# Patient Record
Sex: Male | Born: 2000 | Race: White | Hispanic: No | Marital: Single | Smoking: Current some day smoker
Health system: Southern US, Community
[De-identification: ages and names within clinical notes are randomized; demographics above are authoritative.]

## PROBLEM LIST (undated history)

## (undated) DIAGNOSIS — R480 Dyslexia and alexia: Secondary | ICD-10-CM

## (undated) HISTORY — DX: Dyslexia and alexia: R48.0

---
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
## 2019-04-24 ENCOUNTER — Other Ambulatory Visit: Payer: Self-pay

## 2019-04-24 ENCOUNTER — Encounter: Payer: Self-pay | Admitting: Family Medicine

## 2019-04-24 ENCOUNTER — Ambulatory Visit (INDEPENDENT_AMBULATORY_CARE_PROVIDER_SITE_OTHER): Payer: PRIVATE HEALTH INSURANCE | Admitting: Family Medicine

## 2019-04-24 VITALS — Temp 98.1°F | Ht 73.0 in | Wt 241.6 lb

## 2019-04-24 DIAGNOSIS — H60502 Unspecified acute noninfective otitis externa, left ear: Secondary | ICD-10-CM

## 2019-04-24 DIAGNOSIS — H6120 Impacted cerumen, unspecified ear: Secondary | ICD-10-CM

## 2019-04-24 MED ORDER — NEOMYCIN-POLYMYXIN-HC 3.5-10000-1 OT SOLN: 4.0000 [drp] | Freq: Four times a day (QID) | OTIC | 0 refills | Status: AC

## 2019-04-24 NOTE — Progress Notes (Signed)
   Subjective:    Patient ID: Tracy Riley, male    DOB: 07-31-2001, 18 y.o.   MRN: [id number]  HPItrouble hearing out of left ear yesterday and started having ear pain today.   Ear pain discomfort on the left side felt fullness in the ear could not hear well came here at in order to be seen  Review of Systems Relates ear fullness some pain discomfort denies sinus congestion denies wheezing difficulty breathing    Objective:   Physical Exam  Significant cerumen buildup in left ear attempted to remove manually without success patient with redness and tenderness in the ear  Rest of exam normal lungs clear heart regular probable otitis externa along with cerumen buildup unable to remove it manually therefore we will send to ENT    Assessment & Plan:  Cerumen impaction Otitis externa Antibiotics drops prescribed Referral to ENT for removal

## 2019-05-12 ENCOUNTER — Encounter: Payer: Self-pay | Admitting: Family Medicine

## 2019-05-22 ENCOUNTER — Other Ambulatory Visit: Payer: Self-pay

## 2019-05-22 ENCOUNTER — Other Ambulatory Visit: Payer: Managed Care, Other (non HMO)

## 2019-05-22 DIAGNOSIS — Z20822 Contact with and (suspected) exposure to covid-19: Secondary | ICD-10-CM

## 2019-05-25 LAB — NOVEL CORONAVIRUS, NAA: SARS-CoV-2, NAA: NOT DETECTED

## 2019-06-27 ENCOUNTER — Telehealth: Payer: Self-pay | Admitting: Family Medicine

## 2019-06-27 DIAGNOSIS — Z131 Encounter for screening for diabetes mellitus: Secondary | ICD-10-CM

## 2019-06-27 DIAGNOSIS — Z13220 Encounter for screening for lipoid disorders: Secondary | ICD-10-CM

## 2019-06-27 DIAGNOSIS — Z114 Encounter for screening for human immunodeficiency virus [HIV]: Secondary | ICD-10-CM

## 2019-06-27 NOTE — Telephone Encounter (Signed)
Lipid, glucose Also HIV antibody per CDC guidelines as a one-time screening for his age

## 2019-06-27 NOTE — Telephone Encounter (Signed)
Labs ordered and pt is aware 

## 2019-06-27 NOTE — Telephone Encounter (Signed)
LAST LABS 07/11/18. CBC, BMP, LIVER, LIPID

## 2019-06-27 NOTE — Telephone Encounter (Signed)
HAS AN APPT FOR AUG 14TH FOR PHYSICAL AND NEEDS WELLNESS LABS DONE FOR HIS WORK.  WOULD LIKE TO HAVE THEN DONE IN ADVANCE AT [facility].

## 2019-07-06 LAB — HIV ANTIBODY (ROUTINE TESTING W REFLEX): HIV Screen 4th Generation wRfx: NONREACTIVE

## 2019-07-06 LAB — LIPID PANEL
Chol/HDL Ratio: 3.7 ratio (ref 0.0–5.0)
Cholesterol, Total: 174 mg/dL — ABNORMAL HIGH (ref 100–169)
HDL: 47 mg/dL (ref >39)
LDL Calculated: 107 mg/dL (ref 0–109)
Triglycerides: 100 mg/dL — ABNORMAL HIGH (ref 0–89)
VLDL Cholesterol Cal: 20 mg/dL (ref 5–40)

## 2019-07-06 LAB — GLUCOSE, RANDOM: Glucose: 85 mg/dL (ref 65–99)

## 2019-07-12 ENCOUNTER — Other Ambulatory Visit: Payer: Self-pay

## 2019-07-12 ENCOUNTER — Encounter: Payer: Self-pay | Admitting: Family Medicine

## 2019-07-12 ENCOUNTER — Ambulatory Visit (INDEPENDENT_AMBULATORY_CARE_PROVIDER_SITE_OTHER): Payer: PRIVATE HEALTH INSURANCE | Admitting: Family Medicine

## 2019-07-12 VITALS — BP 122/80 | Temp 98.1°F | Ht 71.0 in | Wt 243.5 lb

## 2019-07-12 DIAGNOSIS — M67912 Unspecified disorder of synovium and tendon, left shoulder: Secondary | ICD-10-CM | POA: Diagnosis not present

## 2019-07-12 DIAGNOSIS — Z0000 Encounter for general adult medical examination without abnormal findings: Secondary | ICD-10-CM | POA: Diagnosis not present

## 2019-07-12 NOTE — Patient Instructions (Signed)
Results for orders placed or performed in visit on 06/27/19  Lipid Profile  Result Value Ref Range   Cholesterol, Total 174 (H) 100 - 169 mg/dL   Triglycerides 100 (H) 0 - 89 mg/dL   HDL 47 >39 mg/dL   VLDL Cholesterol Cal 20 5 - 40 mg/dL   LDL Calculated 107 0 - 109 mg/dL   Chol/HDL Ratio 3.7 0.0 - 5.0 ratio  Glucose  Result Value Ref Range   Glucose 85 65 - 99 mg/dL  HIV antibody (with reflex)  Result Value Ref Range   HIV Screen 4th Generation wRfx Non Reactive Non Reactive
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
## 2019-07-13 ENCOUNTER — Encounter: Payer: Self-pay | Admitting: Family Medicine

## 2019-09-14 ENCOUNTER — Other Ambulatory Visit: Payer: Self-pay | Admitting: *Deleted

## 2019-09-14 ENCOUNTER — Ambulatory Visit (INDEPENDENT_AMBULATORY_CARE_PROVIDER_SITE_OTHER)
Admission: RE | Admit: 2019-09-14 | Discharge: 2019-09-14 | Disposition: A | Discharge status: Home / Self Care | Payer: PRIVATE HEALTH INSURANCE | Source: Ambulatory Visit

## 2019-09-14 DIAGNOSIS — B9789 Other viral agents as the cause of diseases classified elsewhere: Secondary | ICD-10-CM | POA: Diagnosis not present

## 2019-09-14 DIAGNOSIS — Z20822 Contact with and (suspected) exposure to covid-19: Secondary | ICD-10-CM

## 2019-09-14 DIAGNOSIS — R05 Cough: Secondary | ICD-10-CM

## 2019-09-14 DIAGNOSIS — J069 Acute upper respiratory infection, unspecified: Secondary | ICD-10-CM | POA: Diagnosis not present

## 2019-09-14 LAB — NOVEL CORONAVIRUS, NAA
SARS-CoV-2, NAA: DETECTED
SARS-CoV-2, NAA: DETECTED

## 2019-09-14 MED ORDER — CETIRIZINE HCL 10 MG PO CAPS: 10.0000 mg | ORAL_CAPSULE | Freq: Every day | ORAL | 0 refills | Status: DC

## 2019-09-14 MED ORDER — FLUTICASONE PROPIONATE 50 MCG/ACT NA SUSP: 1.0000 | Freq: Every day | NASAL | 2 refills | Status: DC

## 2019-09-14 NOTE — ED Provider Notes (Signed)
Virtual Visit via Video Note:  Tracy Riley  initiated request for Telemedicine visit with [facility] Urgent Care team. I connected with Tracy Riley  on 09/14/2019 at 8:19 PM  for a synchronized telemedicine visit using a video enabled HIPPA compliant telemedicine application. I verified that I am speaking with Tracy Riley  using two identifiers. Creighton Longley C Nichols Corter, PA-C  was physically located in a [facility] Urgent care site and MAHKAI FANGMAN was located at a different location.   The limitations of evaluation and management by telemedicine as well as the availability of in-person appointments were discussed. Patient was informed that he  may incur a bill ( including co-pay) for this virtual visit encounter. Tracy Riley  expressed understanding and gave verbal consent to proceed with virtual visit.     History of Present Illness:Tracy Riley  is a 18 y.o. male presents for evaluation of sinus pain and pressure.  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  He believes that there is a lot of dust and possible mold in the shed he was in and out of.  States that after this yesterday he started develop some congestion and cough.  Overnight he has developed more right-sided sinus pressure and pain.  Feels sinuses are very dry.  He has been using Alka-Seltzer cold and flu which he believes has helped with his symptoms.  He denies any fevers, notes his temperature has been 98 today.  He also does note that he had a recent close exposure to Covid.  He was tested this morning and results are pending.  Denies chest pain or shortness of breath.     Allergies  Allergen Reactions  . Zithromax [Azithromycin] Dermatitis     Past Medical History:  Diagnosis Date  . Dyslexia      Social History   Tobacco Use  . Smoking status: Never Smoker  . Smokeless tobacco: Never Used  Substance Use Topics  . Alcohol use: No  . Drug  use: No        Observations/Objective: Physical Exam  Constitutional: He is oriented to person, place, and time and well-developed, well-nourished, and in no distress. No distress.  HENT:  Head: Normocephalic and atraumatic.  Does not sound significantly congested  Neck: Normal range of motion.  Moving neck appropriately, no obvious erythema or swelling noted  Pulmonary/Chest: Effort normal. No respiratory distress.  Speaking in full sentences, no coughing during video visit  Musculoskeletal: Normal range of motion.     Comments: Moving all extremities appropriately, ambulating around house  Neurological: He is alert and oriented to person, place, and time.  Speech clear, face symmetric     Assessment and Plan:    ICD-10-CM   1. Viral URI with cough  J06.9     Patient likely with viral URI versus allergic rhinitis contributing to symptoms.  Possible Covid, continue to monitor for results to return.  At this time recommending continued symptomatic and supportive care.  Symptoms did not warrant antibiotic therapy at this time.  May continue Alka-Seltzer cold and flu or may try Zyrtec/Flonase.  Push fluids.Discussed strict return precautions. Patient verbalized understanding and is agreeable with plan.    Follow Up Instructions:     I discussed the assessment and treatment plan with the patient. The patient was provided an opportunity to ask questions and all were answered. The patient agreed with the plan and demonstrated an understanding  of the instructions.   The patient was advised to call back or seek an in-person evaluation if the symptoms worsen or if the condition fails to improve as anticipated.      Lew Dawes, PA-C  09/14/2019 8:19 PM        Lew Dawes, PA-C 09/14/19 2020

## 2019-09-14 NOTE — Discharge Instructions (Signed)
May try daily cetirizine/ flonase for sinus congestion/inflammation [place] for COVID results  Drink plenty of fluids  Follow up if symptoms not resolving over the next week

## 2019-09-16 LAB — NOVEL CORONAVIRUS, NAA: SARS-CoV-2, NAA: DETECTED — AB

## 2019-09-19 ENCOUNTER — Telehealth: Payer: Self-pay | Admitting: Family Medicine

## 2019-09-19 NOTE — Telephone Encounter (Signed)
More than likely the test is positive but just has not been posted

## 2019-09-19 NOTE — Telephone Encounter (Signed)
Autumn-call the health department and see if you can figure this out His chart does not reflect the results at this time Unless the health department is psychic I can not imagine how they can call a test positive when the results are not back  

## 2019-09-19 NOTE — Telephone Encounter (Addendum)
Mother states the health department advised her that both of her sons were positive and went over everything with her including quarantine and warning signs- mother is also positive and being followed by health department

## 2019-09-19 NOTE — Telephone Encounter (Signed)
Mom said the Health Department has called her and told her both of her kids, Zach and Collin have both tested positive for Covid.  But she has called [facility] twice and they have told her the tests aren't back yet.  No results in the system.  Health Dept seemed confused about this.  Mom has Covid so kids had to go for testing even though they have no symptoms at all.  She needs to know if they do have positive test results and why would Health Dept call her before the system shows the results?  Note in brothers chart as well. ° °

## 2019-09-20 ENCOUNTER — Telehealth: Payer: Self-pay

## 2019-09-20 NOTE — Telephone Encounter (Signed)
Phone call to pt. To inform of COVID test result; advised the result showed the virus was detected.  Pt. Stated he was tested due to exposure.  Denied any symptoms; denied fever, cough, or shortness of breath.  In starting to advise of the CDC guidelines for self isolation, pt. Reported that he had already received a call from [facility] with recommendations.  Stated he was advised to remain in quarantine through Sun., 11/1.  Pt. Denied any questions at this time.  Advised patient that the result was delayed in reaching patient's chart, (tested 10/22, resulted 10/28) and will send note to Dr. Wolfgang Phoenix, to make him aware.  Pt. Agreed with plan.

## 2019-11-17 ENCOUNTER — Encounter: Payer: Self-pay | Admitting: Family Medicine

## 2019-11-20 ENCOUNTER — Ambulatory Visit (INDEPENDENT_AMBULATORY_CARE_PROVIDER_SITE_OTHER): Payer: PRIVATE HEALTH INSURANCE | Admitting: Family Medicine

## 2019-11-20 DIAGNOSIS — R059 Cough, unspecified: Secondary | ICD-10-CM

## 2019-11-20 DIAGNOSIS — R05 Cough: Secondary | ICD-10-CM

## 2019-11-20 DIAGNOSIS — J4521 Mild intermittent asthma with (acute) exacerbation: Secondary | ICD-10-CM

## 2019-11-20 DIAGNOSIS — J019 Acute sinusitis, unspecified: Secondary | ICD-10-CM | POA: Diagnosis not present

## 2019-11-20 MED ORDER — CEFPROZIL 500 MG PO TABS: 500.0000 mg | ORAL_TABLET | Freq: Two times a day (BID) | ORAL | 0 refills | Status: DC

## 2019-11-20 MED ORDER — ALBUTEROL SULFATE HFA 108 (90 BASE) MCG/ACT IN AERS: 2.0000 | INHALATION_SPRAY | Freq: Four times a day (QID) | RESPIRATORY_TRACT | 0 refills | Status: DC | PRN

## 2019-11-20 MED ORDER — BENZONATATE 100 MG PO CAPS: 100.0000 mg | ORAL_CAPSULE | Freq: Three times a day (TID) | ORAL | 1 refills | Status: DC | PRN

## 2019-11-20 NOTE — Progress Notes (Signed)
   Subjective:    Patient ID: Tracy Riley, male    DOB: Oct 12, 2001, 18 y.o.   MRN: [id number]  Cough This is a recurrent problem. Episode onset: October. The cough is non-productive. Pertinent negatives include no chest pain, chills, ear pain, fever, rhinorrhea or wheezing. Associated symptoms comments: In the mornings pt sits up and has hacking cough. 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 He has tried nothing for the symptoms.  Patient already had Covid back in October Patient has been having ongoing cough early in the morning worse over the past few weeks but is around a smoker on a daily basis.  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  Location: Patient: home Provider: office   I discussed the limitations, risks, security and privacy concerns of performing an evaluation and management service by telephone and the availability of in person appointments. I also discussed with the patient that there may be a patient responsible charge related to this service. The patient expressed understanding and agreed to proceed.   History of Present Illness:    Observations/Objective:   Assessment and Plan:   Follow Up Instructions:    I discussed the assessment and treatment plan with the patient. The patient was provided an opportunity to ask questions and all were answered. The patient agreed with the plan and demonstrated an understanding of the instructions.   The patient was advised to call back or seek an in-person evaluation if the symptoms worsen or if the condition fails to improve as anticipated.  I provided 16 minutes of non-face-to-face time during this  encounter.   Vicente Males, LPN    Review of Systems  Constitutional: Negative for activity change, chills and fever.  HENT: Negative for congestion, ear pain and rhinorrhea.   Eyes: Negative for discharge.  Respiratory: Positive for cough. Negative for wheezing.   Cardiovascular: Negative for chest pain.  Gastrointestinal: Negative for nausea and vomiting.  Musculoskeletal: Negative for arthralgias.       Objective:   Physical Exam Today's visit was via telephone Physical exam was not possible for this visit       Assessment & Plan:  Reactive airway Possible bronchitis possible sinusitis 1 round of antibiotic Encourage patient to get away from smoke for as much as possible Follow-up if progressive troubles or worse or any setbacks May use Tessalon May use albuterol Hold off on any x-ray lab work Give [place] update via [place] within 2 weeks

## 2019-12-15 ENCOUNTER — Encounter: Payer: Self-pay | Admitting: Family Medicine

## 2020-02-21 ENCOUNTER — Encounter: Payer: Self-pay | Admitting: Family Medicine

## 2020-03-06 DIAGNOSIS — G540 Brachial plexus disorders: Secondary | ICD-10-CM | POA: Insufficient documentation

## 2020-03-12 ENCOUNTER — Ambulatory Visit: Payer: PRIVATE HEALTH INSURANCE | Admitting: Family Medicine

## 2020-03-12 ENCOUNTER — Other Ambulatory Visit: Payer: Self-pay

## 2020-03-12 ENCOUNTER — Encounter: Payer: Self-pay | Admitting: Family Medicine

## 2020-03-12 VITALS — BP 118/86 | HR 104 | Temp 98.3°F | Wt 263.4 lb

## 2020-03-12 DIAGNOSIS — M5416 Radiculopathy, lumbar region: Secondary | ICD-10-CM | POA: Diagnosis not present

## 2020-03-12 DIAGNOSIS — M79652 Pain in left thigh: Secondary | ICD-10-CM | POA: Diagnosis not present

## 2020-03-12 MED ORDER — DICLOFENAC SODIUM 75 MG PO TBEC: 75.0000 mg | DELAYED_RELEASE_TABLET | Freq: Two times a day (BID) | ORAL | 0 refills | Status: DC

## 2020-03-12 NOTE — Progress Notes (Signed)
Patient ID: Tracy Riley, male    DOB: 10-01-2001, 19 y.o.   MRN: [id number]   Chief Complaint  Patient presents with  . Hip Pain   Subjective:   HPI Pt stating having left hip/and thigh pain.  started when at work this AM.  Not doing anything at time.  Was just sitting at rest today and started with a small pinching feeling in lower back.  Yesterday moved a small tire about 10 lbs.  No other heavy lifting or exercising.  No meds taken for this.  No h/o back pain in past.  Pain worse with putting weight on the left leg or flexion. 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  Going to his chiropractor in 3 days.  No fall/injury to back/leg. Denies fever, dysuria, saddle anesthesia, bowel/bladder incontinence.   Medical History Tracy Riley has a past medical history of Dyslexia.   Outpatient Encounter Medications as of 03/12/2020  Medication Sig  . diclofenac (VOLTAREN) 75 MG EC tablet Take 1 tablet (75 mg total) by mouth 2 (two) times daily.  . [DISCONTINUED] albuterol (VENTOLIN HFA) 108 (90 Base) MCG/ACT inhaler Inhale 2 puffs into the lungs every 6 (six) hours as needed for wheezing.  . [DISCONTINUED] benzonatate (TESSALON PERLES) 100 MG capsule Take 1 capsule (100 mg total) by mouth 3 (three) times daily as needed for cough.  . [DISCONTINUED] cefPROZIL (CEFZIL) 500 MG tablet Take 1 tablet (500 mg total) by mouth 2 (two) times daily.  . [DISCONTINUED] Cetirizine HCl 10 MG CAPS Take 1 capsule (10 mg total) by mouth daily for 14 days.  . [DISCONTINUED] fluticasone (FLONASE) 50 MCG/ACT nasal spray Place 1-2 sprays into both nostrils daily. (Patient not taking: Reported on 11/20/2019)   No facility-administered encounter medications on file as of 03/12/2020.     Review of Systems  Constitutional: Negative for chills and fever.  HENT: Negative for congestion, rhinorrhea and sore throat.   Respiratory: Negative for cough, shortness of breath and wheezing.     Cardiovascular: Negative for chest pain and leg swelling.  Gastrointestinal: Negative for abdominal pain, diarrhea, nausea and vomiting.  Genitourinary: Negative for dysuria and frequency.  Musculoskeletal: Positive for back pain (left lower back). Negative for joint swelling and neck pain.       +Left leg pain  Skin: Negative for rash.  Neurological: Negative for dizziness, weakness and headaches.     Vitals BP 118/86   Pulse (!) 104   Temp 98.3 F (36.8 C)   Wt 263 lb 6.4 oz (119.5 kg)   SpO2 97%   BMI 36.74 kg/m   Objective:   Physical Exam Constitutional:      General: He is not in acute distress.    Appearance: Normal appearance.  HENT:     Head: Normocephalic.     Nose: Nose normal. No congestion.     Mouth/Throat:     Mouth: Mucous membranes are moist.     Pharynx: No oropharyngeal exudate.  Eyes:     Extraocular Movements: Extraocular movements intact.     Conjunctiva/sclera: Conjunctivae normal.     Pupils: Pupils are equal, round, and reactive to light.  Pulmonary:     Effort: No respiratory distress.  Musculoskeletal:        General: Normal range of motion.       Back:     Right lower leg: No edema.     Left lower leg: No edema.  Comments: Normal rom of left knee/ankle and hip.  Skin:    General: Skin is warm and dry.     Findings: No rash.  Neurological:     General: No focal deficit present.     Mental Status: He is alert and oriented to person, place, and time.  Psychiatric:        Mood and Affect: Mood normal.        Behavior: Behavior normal.      Assessment and Plan   1. Lumbar radiculopathy - diclofenac (VOLTAREN) 75 MG EC tablet; Take 1 tablet (75 mg total) by mouth 2 (two) times daily.  Dispense: 30 tablet; Refill: 0  2. Pain of left thigh - diclofenac (VOLTAREN) 75 MG EC tablet; Take 1 tablet (75 mg total) by mouth 2 (two) times daily.  Dispense: 30 tablet; Refill: 0   Likely lumbar radiculopathy vs. Muscle spasm.  Advising  voltaren bid and heating pad and topical icy hot prn. Call or rto if not improving in next 7-10 days.  Pt in agreement.  F/u prn.

## 2020-04-11 ENCOUNTER — Telehealth: Payer: Self-pay | Admitting: Family Medicine

## 2020-04-11 DIAGNOSIS — Z114 Encounter for screening for human immunodeficiency virus [HIV]: Secondary | ICD-10-CM

## 2020-04-11 DIAGNOSIS — Z13220 Encounter for screening for lipoid disorders: Secondary | ICD-10-CM

## 2020-04-11 DIAGNOSIS — Z79899 Other long term (current) drug therapy: Secondary | ICD-10-CM

## 2020-04-11 NOTE — Telephone Encounter (Signed)
Lab orders placed and pt is aware 

## 2020-04-11 NOTE — Telephone Encounter (Signed)
Last labs completed on 07/05/2019 HIV, glucose and lipid. Please advise. Thank you

## 2020-04-11 NOTE — Telephone Encounter (Signed)
Need to have blood work done soon as possible for work. He is wanting to know if he can get it done tomorrow.

## 2020-04-11 NOTE — Telephone Encounter (Signed)
Sorry I have no idea what that means--what b w and for what reason?

## 2020-04-11 NOTE — Telephone Encounter (Signed)
Pt states he has to get blood work done every year for work-- I let him know what tests were done in 2019 and 2020 and he states he needs to have the HIV glucose and lip again for this year.

## 2020-04-11 NOTE — Telephone Encounter (Signed)
Lip liv m7 hiv

## 2020-04-12 ENCOUNTER — Encounter: Payer: Self-pay | Admitting: Family Medicine

## 2020-04-12 ENCOUNTER — Other Ambulatory Visit: Payer: Self-pay

## 2020-04-12 ENCOUNTER — Ambulatory Visit: Payer: No Typology Code available for payment source | Admitting: Family Medicine

## 2020-04-12 VITALS — BP 132/80 | Temp 97.6°F | Ht 71.0 in | Wt 258.0 lb

## 2020-04-12 DIAGNOSIS — S8011XD Contusion of right lower leg, subsequent encounter: Secondary | ICD-10-CM

## 2020-04-12 DIAGNOSIS — M79604 Pain in right leg: Secondary | ICD-10-CM | POA: Diagnosis not present
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
## 2020-04-13 LAB — LIPID PANEL
Chol/HDL Ratio: 3.6 ratio (ref 0.0–5.0)
Cholesterol, Total: 150 mg/dL (ref 100–199)
HDL: 42 mg/dL (ref >39)
LDL Chol Calc (NIH): 84 mg/dL (ref 0–99)
Triglycerides: 133 mg/dL (ref 0–149)
VLDL Cholesterol Cal: 24 mg/dL (ref 5–40)

## 2020-04-13 LAB — BASIC METABOLIC PANEL
BUN/Creatinine Ratio: 9 (ref 9–20)
BUN: 9 mg/dL (ref 6–20)
CO2: 24 mmol/L (ref 20–29)
Calcium: 9.4 mg/dL (ref 8.7–10.2)
Chloride: 101 mmol/L (ref 96–106)
Creatinine, Ser: 1.05 mg/dL (ref 0.76–1.27)
GFR calc Af Amer: 118 mL/min/{1.73_m2} (ref >59)
GFR calc non Af Amer: 102 mL/min/{1.73_m2} (ref >59)
Glucose: 79 mg/dL (ref 65–99)
Potassium: 4.1 mmol/L (ref 3.5–5.2)
Sodium: 139 mmol/L (ref 134–144)

## 2020-04-13 LAB — HEPATIC FUNCTION PANEL
ALT: 18 IU/L (ref 0–44)
AST: 14 IU/L (ref 0–40)
Albumin: 4.3 g/dL (ref 4.1–5.2)
Alkaline Phosphatase: 112 IU/L (ref 55–125)
Bilirubin Total: 0.6 mg/dL (ref 0.0–1.2)
Bilirubin, Direct: 0.15 mg/dL (ref 0.00–0.40)
Total Protein: 6.9 g/dL (ref 6.0–8.5)

## 2020-04-13 LAB — HIV ANTIBODY (ROUTINE TESTING W REFLEX): HIV Screen 4th Generation wRfx: NONREACTIVE

## 2020-04-14 ENCOUNTER — Encounter: Payer: Self-pay | Admitting: Family Medicine

## 2020-04-15 ENCOUNTER — Ambulatory Visit (HOSPITAL_COMMUNITY)
Admission: RE | Admit: 2020-04-15 | Discharge: 2020-04-15 | Disposition: A | Discharge status: Home / Self Care | Payer: No Typology Code available for payment source | Source: Ambulatory Visit | Attending: Family Medicine | Admitting: Family Medicine

## 2020-04-15 ENCOUNTER — Encounter: Payer: Self-pay | Admitting: Family Medicine

## 2020-04-15 ENCOUNTER — Other Ambulatory Visit: Payer: Self-pay | Admitting: Family Medicine

## 2020-04-15 ENCOUNTER — Other Ambulatory Visit: Payer: Self-pay

## 2020-04-15 ENCOUNTER — Ambulatory Visit: Payer: No Typology Code available for payment source | Admitting: Family Medicine

## 2020-04-15 VITALS — BP 122/84 | Temp 98.2°F | Wt 258.6 lb

## 2020-04-15 DIAGNOSIS — S8011XD Contusion of right lower leg, subsequent encounter: Secondary | ICD-10-CM

## 2020-04-15 DIAGNOSIS — M7989 Other specified soft tissue disorders: Secondary | ICD-10-CM | POA: Insufficient documentation
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
## 2020-04-16 ENCOUNTER — Encounter: Payer: Self-pay | Admitting: Family Medicine

## 2020-04-16 LAB — D-DIMER, QUANTITATIVE: D-DIMER: 0.33 mg/L FEU (ref 0.00–0.49)

## 2020-04-19 ENCOUNTER — Encounter: Payer: Self-pay | Admitting: Family Medicine

## 2020-05-10 ENCOUNTER — Ambulatory Visit: Payer: No Typology Code available for payment source | Admitting: Family Medicine

## 2020-06-30 ENCOUNTER — Encounter: Payer: Self-pay | Admitting: Family Medicine

## 2020-08-19 ENCOUNTER — Encounter: Payer: Self-pay | Admitting: Family Medicine

## 2020-08-19 DIAGNOSIS — L600 Ingrowing nail: Secondary | ICD-10-CM

## 2020-08-20 MED ORDER — CEPHALEXIN 500 MG PO CAPS
ORAL_CAPSULE | ORAL | 0 refills | Status: DC
Start: 2020-08-20 — End: 2020-11-28

## 2020-08-20 NOTE — Telephone Encounter (Signed)
I recommend warm soaks Keflex 500 mg 1 taken 3 times daily for the next 7 days I also recommend referral to podiatry he will need partial removal of toenail

## 2020-08-20 NOTE — Addendum Note (Signed)
Addended by: Marlowe Shores on: 08/20/2020 01:44 PM   Modules accepted: Orders

## 2020-08-30 ENCOUNTER — Encounter: Payer: Self-pay | Admitting: Family Medicine

## 2020-09-20 ENCOUNTER — Ambulatory Visit (INDEPENDENT_AMBULATORY_CARE_PROVIDER_SITE_OTHER): Payer: No Typology Code available for payment source | Admitting: Family Medicine

## 2020-09-20 ENCOUNTER — Other Ambulatory Visit: Payer: Self-pay

## 2020-09-20 ENCOUNTER — Encounter: Payer: Self-pay | Admitting: Family Medicine

## 2020-09-20 VITALS — BP 122/82 | HR 82 | Temp 97.8°F | Ht 71.0 in | Wt 255.6 lb

## 2020-09-20 DIAGNOSIS — Z0000 Encounter for general adult medical examination without abnormal findings: Secondary | ICD-10-CM | POA: Diagnosis not present

## 2020-09-20 NOTE — Patient Instructions (Addendum)
Preventive Care 48-20 Years Old, Male Preventive care refers to lifestyle choices and visits with your health care provider that can promote health and wellness. This includes:  A yearly physical exam. This is also called an annual well check.  Regular dental and eye exams.  Immunizations.  Screening for certain conditions.  Healthy lifestyle choices, such as eating a healthy diet, getting regular exercise, not using drugs or products that contain nicotine and tobacco, and limiting alcohol use. What can I expect for my preventive care visit? Physical exam Your health care provider will check:  Height and weight. These may be used to calculate body mass index (BMI), which is a measurement that tells if you are at a healthy weight.  Heart rate and blood pressure.  Your skin for abnormal spots. Counseling Your health care provider may ask you questions about:  Alcohol, tobacco, and drug use.  Emotional well-being.  Home and relationship well-being.  Sexual activity.  Eating habits.  Work and work Statistician. What immunizations do I need?  Influenza (flu) vaccine  This is recommended every year. Tetanus, diphtheria, and pertussis (Tdap) vaccine  You may need a Td booster every 10 years. Varicella (chickenpox) vaccine  You may need this vaccine if you have not already been vaccinated. Human papillomavirus (HPV) vaccine  If recommended by your health care provider, you may need three doses over 6 months. Measles, mumps, and rubella (MMR) vaccine  You may need at least one dose of MMR. You may also need a second dose. Meningococcal conjugate (MenACWY) vaccine  One dose is recommended if you are 47-13 years old and a Market researcher living in a residence hall, or if you have one of several medical conditions. You may also need additional booster doses. Pneumococcal conjugate (PCV13) vaccine  You may need this if you have certain conditions and were not  previously vaccinated. Pneumococcal polysaccharide (PPSV23) vaccine  You may need one or two doses if you smoke cigarettes or if you have certain conditions. Hepatitis A vaccine  You may need this if you have certain conditions or if you travel or work in places where you may be exposed to hepatitis A. Hepatitis B vaccine  You may need this if you have certain conditions or if you travel or work in places where you may be exposed to hepatitis B. Haemophilus influenzae type b (Hib) vaccine  You may need this if you have certain risk factors. You may receive vaccines as individual doses or as more than one vaccine together in one shot (combination vaccines). Talk with your health care provider about the risks and benefits of combination vaccines. What tests do I need? Blood tests  Lipid and cholesterol levels. These may be checked every 5 years starting at age 5.  Hepatitis C test.  Hepatitis B test. Screening   Diabetes screening. This is done by checking your blood sugar (glucose) after you have not eaten for a while (fasting).  Sexually transmitted disease (STD) testing. Talk with your health care provider about your test results, treatment options, and if necessary, the need for more tests. Follow these instructions at home: Eating and drinking   Eat a diet that includes fresh fruits and vegetables, whole grains, lean protein, and low-fat dairy products.  Take vitamin and mineral supplements as recommended by your health care provider.  Do not drink alcohol if your health care provider tells you not to drink.  If you drink alcohol: ? Limit how much you have to 0-2  drinks a day. ? Be aware of how much alcohol is in your drink. In the [place] one drink equals one 12 oz bottle of beer (355 mL), one 5 oz glass of wine (148 mL), or one 1 oz glass of hard liquor (44 mL). Lifestyle  Take daily care of your teeth and gums.  Stay active. Exercise for at least 30 minutes on 5 or  more days each week.  Do not use any products that contain nicotine or tobacco, such as cigarettes, e-cigarettes, and chewing tobacco. If you need help quitting, ask your health care provider.  If you are sexually active, practice safe sex. Use a condom or other form of protection to prevent STIs (sexually transmitted infections). What's next?  Go to your health care provider once a year for a well check visit.  Ask your health care provider how often you should have your eyes and teeth checked.  Stay up to date on all vaccines. This information is not intended to replace advice given to you by your health care provider. Make sure you discuss any questions you have with your health care provider. Document Revised: 11/03/2018 Document Reviewed: 11/03/2018 Elsevier Patient Education  [place] Headache Without Cause A headache is pain or discomfort that is felt around the head or neck area. There are many causes and types of headaches. In some cases, the cause may not be found. Follow these instructions at home: Watch your condition for any changes. Let your doctor know about them. Take these steps to help with your condition: Managing pain      Take over-the-counter and prescription medicines only as told by your doctor.  Lie down in a dark, quiet room when you have a headache.  If told, put ice on your head and neck area: ? Put ice in a plastic bag. ? Place a towel between your skin and the bag. ? Leave the ice on for 20 minutes, 2-3 times per day.  If told, put heat on the affected area. Use the heat source that your doctor recommends, such as a moist heat pack or a heating pad. ? Place a towel between your skin and the heat source. ? Leave the heat on for 20-30 minutes. ? Remove the heat if your skin turns bright red. This is very important if you are unable to feel pain, heat, or cold. You may have a greater risk of getting burned.  Keep lights dim if bright  lights bother you or make your headaches worse. Eating and drinking  Eat meals on a regular schedule.  If you drink alcohol: ? Limit how much you use to:  0-1 drink a day for women.  0-2 drinks a day for men. ? Be aware of how much alcohol is in your drink. In the [place] one drink equals one 12 oz bottle of beer (355 mL), one 5 oz glass of wine (148 mL), or one 1 oz glass of hard liquor (44 mL).  Stop drinking caffeine, or reduce how much caffeine you drink. General instructions   Keep a journal to find out if certain things bring on headaches. For example, write down: ? What you eat and drink. ? How much sleep you get. ? Any change to your diet or medicines.  Get a massage or try other ways to relax.  Limit stress.  Sit up straight. Do not tighten (tense) your muscles.  Do not use any products that contain nicotine or tobacco. This includes cigarettes,  e-cigarettes, and chewing tobacco. If you need help quitting, ask your doctor.  Exercise regularly as told by your doctor.  Get enough sleep. This often means 7-9 hours of sleep each night.  Keep all follow-up visits as told by your doctor. This is important. Contact a doctor if:  Your symptoms are not helped by medicine.  You have a headache that feels different than the other headaches.  You feel sick to your stomach (nauseous) or you throw up (vomit).  You have a fever. Get help right away if:  Your headache gets very bad quickly.  Your headache gets worse after a lot of physical activity.  You keep throwing up.  You have a stiff neck.  You have trouble seeing.  You have trouble speaking.  You have pain in the eye or ear.  Your muscles are weak or you lose muscle control.  You lose your balance or have trouble walking.  You feel like you will pass out (faint) or you pass out.  You are mixed up (confused).  You have a seizure. Summary  A headache is pain or discomfort that is felt around the  head or neck area.  There are many causes and types of headaches. In some cases, the cause may not be found.  Keep a journal to help find out what causes your headaches. Watch your condition for any changes. Let your doctor know about them.  Contact a doctor if you have a headache that is different from usual, or if your headache is not helped by medicine.  Get help right away if your headache gets very bad, you throw up, you have trouble seeing, you lose your balance, or you have a seizure. This information is not intended to replace advice given to you by your health care provider. Make sure you discuss any questions you have with your health care provider. Document Revised: 05/30/2018 Document Reviewed: 05/30/2018 Elsevier Patient Education  [place].

## 2020-09-20 NOTE — Progress Notes (Signed)
Patient ID: Tracy Riley, male    DOB: Nov 01, 2001, 19 y.o.   MRN: [id number]   Chief Complaint  Patient presents with  . Annual Exam   Subjective:  CC: annual wellness  Presents today for a wellness visit.  He is going into [employer], trying to get in shape, has recently lost 10 pounds intentionally.  It is noted that on October 8. A   Referral was sent for podiatry for an ingrown toenail, he has a visit with them next week.  His weight loss goals are an additional 25 pounds.  He reports that he has a dull headache much of the time, his last negative Covid test was 2 weeks ago, he tries to stay hydrated, he takes Aleve which works, however it sounds like his sleep habits are not great.    Medical History Mckinley has a past medical history of Dyslexia.   Outpatient Encounter Medications as of 09/20/2020  Medication Sig  . cephALEXin (KEFLEX) 500 MG capsule Take one capsule po TID for next 7 days (Patient not taking: Reported on 09/20/2020)   No facility-administered encounter medications on file as of 09/20/2020.     Review of Systems  Constitutional: Negative for chills, fever and unexpected weight change.  Respiratory: Negative for cough, chest tightness and shortness of breath.   Cardiovascular: Negative for chest pain and leg swelling.  Gastrointestinal: Negative for abdominal pain, blood in stool, diarrhea, nausea and vomiting.  Genitourinary: Negative for dysuria, hematuria, scrotal swelling and testicular pain.  Neurological: Positive for headaches.       Dull constant headache 1/10.  Psychiatric/Behavioral: Negative for self-injury.     Vitals BP 122/82   Pulse 82   Temp 97.8 F (36.6 C) (Oral)   Ht 5\' 11"  (1.803 m)   Wt 255 lb 9.6 oz (115.9 kg)   SpO2 98%   BMI 35.65 kg/m   Objective:   Physical Exam Vitals and nursing note reviewed.  Constitutional:      Appearance: Normal appearance. He is obese.     Comments: Actively working on lifestyle  modifications.   HENT:     Head: Normocephalic.     Right Ear: Tympanic membrane normal.     Left Ear: Tympanic membrane normal.     Nose: Nose normal.     Mouth/Throat:     Mouth: Mucous membranes are moist.     Pharynx: Oropharynx is clear.  Cardiovascular:     Rate and Rhythm: Normal rate and regular rhythm.     Heart sounds: Normal heart sounds.  Pulmonary:     Effort: Pulmonary effort is normal.     Breath sounds: Normal breath sounds.  Abdominal:     General: Bowel sounds are normal.     Palpations: Abdomen is soft.     Tenderness: There is no abdominal tenderness.  Genitourinary:    Comments: Performs self-testicular exams monthly.  Musculoskeletal:        General: Normal range of motion.     Cervical back: Normal range of motion.  Skin:    General: Skin is warm and dry.  Neurological:     Mental Status: He is alert and oriented to person, place, and time.  Psychiatric:        Mood and Affect: Mood normal.        Behavior: Behavior normal.        Thought Content: Thought content normal.        Judgment: Judgment normal.  Assessment and Plan   1. Well adult on routine health check   Encouraged continued healthy habits, weight loss goal is 220- 225 pounds.  Questions regarding lactose intolerant, seems to tolerate some dairy but not all, showed me a picture of  his right lower extremity where he dropped a truck on his leg, reports that he does not have any feeling in this area, it is a miracle he did not lose his extremity. The bruising has healed as this occurred in May 2021.  Last labs were drawn in May 2021, I am sure they have already been reviewed, however,  I reviewed them once again with him.  He is not being treated for any chronic conditions.  Encouraged better sleep hygiene and consistent hydration for the headaches.  Agrees with plan of care discussed today. Understands warning signs to seek further care: Any significant changes in health  status. Understands to follow-up in one year for wellness, sooner if anything changes.    Novella Olive, NP 09/20/2020

## 2020-09-20 NOTE — Progress Notes (Signed)
The patient comes in today for a wellness visit.    Patient ID: Tracy Riley, male    DOB: [birth year], 19 y.o.   MRN: [id number]   Chief Complaint  Patient presents with  . Annual Exam   Subjective:    HPI   Medical History Tracy Riley has a past medical history of Dyslexia.   Outpatient Encounter Medications as of 09/20/2020  Medication Sig  . cephALEXin (KEFLEX) 500 MG capsule Take one capsule po TID for next 7 days (Patient not taking: Reported on 09/20/2020)   No facility-administered encounter medications on file as of 09/20/2020.     Review of Systems   Vitals BP 122/82   Pulse 82   Temp 97.8 F (36.6 C) (Oral)   Ht 5\' 11"  (1.803 m)   Wt 255 lb 9.6 oz (115.9 kg)   SpO2 98%   BMI 35.65 kg/m   Objective:   Physical Exam   Assessment and Plan   There are no diagnoses linked to this encounter.    A review of their health history was completed.  A review of medications was also completed.  Any needed refills; none  Eating habits: eating healthy seeing Dr for weight loss and diet  Falls/  MVA accidents in past few months: none  Regular exercise: everyday- training for [employer]  Specialist pt sees on regular basis: non  Preventative health issues were discussed.   Additional concerns: none

## 2020-09-25 ENCOUNTER — Encounter: Payer: Self-pay | Admitting: Family Medicine

## 2020-09-25 DIAGNOSIS — L600 Ingrowing nail: Secondary | ICD-10-CM

## 2020-09-25 NOTE — Addendum Note (Signed)
Addended by: Marlowe Shores on: 09/25/2020 02:10 PM   Modules accepted: Orders

## 2020-09-25 NOTE — Telephone Encounter (Signed)
Nurses May put in additional referral for podiatry This can be at a different podiatrist thanks Ingrown toenail will need removal and ablation

## 2020-10-09 ENCOUNTER — Ambulatory Visit: Payer: No Typology Code available for payment source | Admitting: Podiatry

## 2020-10-14 ENCOUNTER — Ambulatory Visit (INDEPENDENT_AMBULATORY_CARE_PROVIDER_SITE_OTHER): Payer: No Typology Code available for payment source | Admitting: Podiatry

## 2020-10-14 ENCOUNTER — Encounter: Payer: Self-pay | Admitting: Podiatry

## 2020-10-14 ENCOUNTER — Other Ambulatory Visit: Payer: Self-pay

## 2020-10-14 DIAGNOSIS — L600 Ingrowing nail: Secondary | ICD-10-CM | POA: Diagnosis not present

## 2020-10-20 NOTE — Progress Notes (Signed)
Subjective:   Patient ID: Tracy Riley, male   DOB: 19 y.o.   MRN: [id number]   HPI Patient presents stating he has had a painful ingrown toenail of his right big toe and states he tried to trim it and soak it and has had some history left not as painful.  Patient does not smoke likes to be active   Review of Systems  All other systems reviewed and are negative.       Objective:  Physical Exam Vitals and nursing note reviewed.  Constitutional:      Appearance: He is well-developed.  Pulmonary:     Effort: Pulmonary effort is normal.  Musculoskeletal:        General: Normal range of motion.  Skin:    General: Skin is warm.  Neurological:     Mental Status: He is alert.     Neurovascular status intact muscle strength adequate range of motion adequate.  Patient's left hallux is mildly ingrown and the right hallux is quite incurvated in the corner medial corner with pain and no redness no active drainage noted     Assessment:  Ingrown toenail deformity of the right over left hallux painful when palpated     Plan:  H&P reviewed condition and recommended correction of deformity.  Explained procedure risk and patient signed consent form and today I infiltrated the right hallux 60 mg Xylocaine Marcaine mixture sterile prep done and using sterile instrumentation remove the border exposed matrix applied phenol 3 applications 30 seconds followed by alcohol lavage sterile dressing and gave instructions on soaks.  Reappoint to recheck and encouraged to leave dressing on 24 hours but take it off earlier if any throbbing were to occur and encouraged to call with questions concerns

## 2020-11-28 ENCOUNTER — Encounter: Payer: Self-pay | Admitting: Family Medicine

## 2020-11-28 ENCOUNTER — Other Ambulatory Visit: Payer: Self-pay

## 2020-11-28 ENCOUNTER — Ambulatory Visit (INDEPENDENT_AMBULATORY_CARE_PROVIDER_SITE_OTHER): Payer: No Typology Code available for payment source | Admitting: Family Medicine

## 2020-11-28 VITALS — HR 88 | Temp 98.6°F | Resp 16

## 2020-11-28 DIAGNOSIS — R059 Cough, unspecified: Secondary | ICD-10-CM

## 2020-11-28 DIAGNOSIS — H66002 Acute suppurative otitis media without spontaneous rupture of ear drum, left ear: Secondary | ICD-10-CM | POA: Diagnosis not present

## 2020-11-28 LAB — POCT RAPID STREP A (OFFICE): Rapid Strep A Screen: NEGATIVE

## 2020-11-28 MED ORDER — BENZONATATE 100 MG PO CAPS: 100.0000 mg | ORAL_CAPSULE | Freq: Two times a day (BID) | ORAL | 0 refills | Status: DC | PRN

## 2020-11-28 MED ORDER — AMOXICILLIN 500 MG PO CAPS: 500.0000 mg | ORAL_CAPSULE | Freq: Two times a day (BID) | ORAL | 0 refills | Status: DC

## 2020-11-28 NOTE — Patient Instructions (Signed)
Check blood sugar and let us know if it is > 100 in the morning before eating. If the lymph node in neck does not go away after antibiotic come back to be seen.      Otitis Media, Adult  Otitis media means that the middle ear is red and swollen (inflamed) and full of fluid. The condition usually goes away on its own. Follow these instructions at home:  Take over-the-counter and prescription medicines only as told by your doctor.  If you were prescribed an antibiotic medicine, take it as told by your doctor. Do not stop taking the antibiotic even if you start to feel better.  Keep all follow-up visits as told by your doctor. This is important. Contact a doctor if:  You have bleeding from your nose.  There is a lump on your neck.  You are not getting better in 5 days.  You feel worse instead of better. Get help right away if:  You have pain that is not helped with medicine.  You have swelling, redness, or pain around your ear.  You get a stiff neck.  You cannot move part of your face (paralyzed).  You notice that the bone behind your ear hurts when you touch it.  You get a very bad headache. Summary  Otitis media means that the middle ear is red, swollen, and full of fluid.  This condition usually goes away on its own. In some cases, treatment may be needed.  If you were prescribed an antibiotic medicine, take it as told by your doctor. This information is not intended to replace advice given to you by your health care provider. Make sure you discuss any questions you have with your health care provider. Document Revised: 10/22/2017 Document Reviewed: 11/30/2016 Elsevier Patient Education  2020 [employer].

## 2020-11-28 NOTE — Progress Notes (Signed)
Patient ID: Tracy Riley, male    DOB: 11-11-2001, 20 y.o.   MRN: [id number]   Chief Complaint  Patient presents with  . Cough   Subjective:  CC: cough and ear pain  This is a new problem.  Presents today for an acute visit with a complaint of cough with sputum production.  Associated symptoms include headache, nasal congestion, postnasal drip, runny nose, and sore throat.  Pertinent negatives include no fever, no chills, no chest pain, no shortness of breath.  Sore throat appeared today with cough.  He reports that he has had a headache off and on for months.  Reports that his lymph nodes are swollen as well.  Has tried over-the-counter allergy medication and Aleve which has helped some.  Cough This is a new problem. Episode onset: 2 weeks. The problem occurs constantly. The cough is productive of sputum. Associated symptoms include headaches, nasal congestion, postnasal drip, rhinorrhea and a sore throat. Pertinent negatives include no chest pain, chills, ear pain, fever or shortness of breath. Associated symptoms comments: Swollen lymph nodes under jaw and ears- painful to touch and movement.  Headaches on and off for 2 months. . Treatments tried: otc allergy meds.      Medical History Piercen has a past medical history of Dyslexia.   Outpatient Encounter Medications as of 11/28/2020  Medication Sig  . amoxicillin (AMOXIL) 500 MG capsule Take 1 capsule (500 mg total) by mouth 2 (two) times daily.  . benzonatate (TESSALON) 100 MG capsule Take 1 capsule (100 mg total) by mouth 2 (two) times daily as needed for cough.  . [DISCONTINUED] cephALEXin (KEFLEX) 500 MG capsule Take one capsule po TID for next 7 days (Patient not taking: Reported on 09/20/2020)   No facility-administered encounter medications on file as of 11/28/2020.     Review of Systems  Constitutional: Negative for chills and fever.  HENT: Positive for congestion, postnasal drip, rhinorrhea, sinus pressure, sinus pain  and sore throat. Negative for ear pain.   Respiratory: Positive for cough. Negative for shortness of breath.   Cardiovascular: Negative for chest pain.  Gastrointestinal: Negative for abdominal pain.  Neurological: Positive for headaches.       Since covid in oct 2020  Hematological: Positive for adenopathy.     Vitals Pulse 88   Temp 98.6 F (37 C)   Resp 16   SpO2 99%   Objective:   Physical Exam Vitals reviewed.  Constitutional:      General: He is not in acute distress. HENT:     Right Ear: Tympanic membrane normal.     Left Ear: Tenderness present. Tympanic membrane is erythematous.     Nose: Rhinorrhea present.     Left Turbinates: Swollen.     Right Sinus: No maxillary sinus tenderness or frontal sinus tenderness.     Left Sinus: No maxillary sinus tenderness or frontal sinus tenderness.     Mouth/Throat:     Pharynx: Posterior oropharyngeal erythema present.     Tonsils: 1+ on the right. 1+ on the left.  Cardiovascular:     Rate and Rhythm: Normal rate and regular rhythm.     Heart sounds: Normal heart sounds.  Pulmonary:     Effort: Pulmonary effort is normal.     Breath sounds: Normal breath sounds.  Lymphadenopathy:     Cervical: Cervical adenopathy present.  Skin:    General: Skin is warm and dry.  Neurological:     General: No focal deficit present.  Mental Status: He is alert.  Psychiatric:        Behavior: Behavior normal.      Assessment and Plan   1. Cough in adult - benzonatate (TESSALON) 100 MG capsule; Take 1 capsule (100 mg total) by mouth 2 (two) times daily as needed for cough.  Dispense: 20 capsule; Refill: 0 - Culture, Group A Strep - POCT rapid strep A - Novel Coronavirus, NAA ([facility])  2. Non-recurrent acute suppurative otitis media of left ear without spontaneous rupture of tympanic membrane - amoxicillin (AMOXIL) 500 MG capsule; Take 1 capsule (500 mg total) by mouth 2 (two) times daily.  Dispense: 14 capsule; Refill: 0    Left TM erythematous, left cervical adenopathy, will treat for otitis media.  Supportive therapy recommended, adequate hydration, saline nasal flushes, over-the-counter medications for symptom relief, medication sent for cough.  Rapid strep negative, will send for culture.  COVID test pending, will notify once results are available.  At the end of the visit he reports that earlier today his right hand was shaking/with tremors.  Reports this happened twice and resolved.  He wonders if he has diabetes.  Reports that he has a glucose meter, he is instructed to take his blood sugar several times fasting to see what his morning blood sugar is.  Agrees with plan of care discussed today. Understands warning signs to seek further care: Pain, shortness of breath, any significant change in health. Understands to follow-up if symptoms do not improve, or worsen.  He is instructed to follow-up once antibiotic is completed if his cervical adenopathy  does not resolve.  He will also follow-up if his blood sugars are consistently greater than 100 fasting.

## 2020-11-30 LAB — NOVEL CORONAVIRUS, NAA: SARS-CoV-2, NAA: DETECTED — AB

## 2020-11-30 LAB — SARS-COV-2, NAA 2 DAY TAT

## 2020-12-01 ENCOUNTER — Other Ambulatory Visit: Payer: Self-pay | Admitting: Family Medicine

## 2020-12-01 ENCOUNTER — Encounter: Payer: Self-pay | Admitting: Family Medicine

## 2020-12-01 DIAGNOSIS — U071 COVID-19: Secondary | ICD-10-CM | POA: Insufficient documentation

## 2020-12-01 LAB — SPECIMEN STATUS REPORT

## 2020-12-01 LAB — CULTURE, GROUP A STREP: Strep A Culture: NEGATIVE

## 2020-12-01 MED ORDER — ALBUTEROL SULFATE HFA 108 (90 BASE) MCG/ACT IN AERS: 2.0000 | INHALATION_SPRAY | Freq: Four times a day (QID) | RESPIRATORY_TRACT | 0 refills | Status: DC | PRN

## 2020-12-01 NOTE — Progress Notes (Signed)
Covid detected per PCR.

## 2020-12-06 ENCOUNTER — Encounter: Payer: Self-pay | Admitting: Family Medicine

## 2021-01-19 ENCOUNTER — Encounter: Payer: No Typology Code available for payment source | Admitting: Oncology

## 2021-01-19 ENCOUNTER — Encounter: Payer: Self-pay | Admitting: Oncology

## 2021-01-19 DIAGNOSIS — R112 Nausea with vomiting, unspecified: Secondary | ICD-10-CM | POA: Diagnosis not present

## 2021-01-19 MED ORDER — ONDANSETRON HCL 4 MG PO TABS: 4.0000 mg | ORAL_TABLET | Freq: Three times a day (TID) | ORAL | 0 refills | Status: DC | PRN

## 2021-01-19 NOTE — Progress Notes (Signed)
Tracy Riley, hoopes are scheduled for a virtual visit with your provider today.    Just as we do with appointments in the office, we must obtain your consent to participate.  Your consent will be active for this visit and any virtual visit you may have with one of our providers in the next 365 days.    If you have a MyChart account, I can also send a copy of this consent to you electronically.  All virtual visits are billed to your insurance company just like a traditional visit in the office.  As this is a virtual visit, video technology does not allow for your provider to perform a traditional examination.  This may limit your provider's ability to fully assess your condition.  If your provider identifies any concerns that need to be evaluated in person or the need to arrange testing such as labs, EKG, etc, we will make arrangements to do so.    Although advances in technology are sophisticated, we cannot ensure that it will always work on either your end or our end.  If the connection with a video visit is poor, we may have to switch to a telephone visit.  With either a video or telephone visit, we are not always able to ensure that we have a secure connection.   I need to obtain your verbal consent now.   Are you willing to proceed with your visit today?   Tracy Riley has provided verbal consent on 01/24/2021 for a virtual visit (video or telephone).   Mauro Kaufmann, NP 01/24/2021  10:19 AM  Virtual Visit via Video Note  I connected with Tracy Riley on 01/24/21 at  7:15 PM EST by a video enabled telemedicine application and verified that I am speaking with the correct person using two identifiers.  Location: Patient: Home Provider: Home Office  I discussed the limitations of evaluation and management by telemedicine and the availability of in person appointments. The patient expressed understanding and agreed to proceed.  History of Present Illness: Tracy Riley is a 20 year old  male with past medical history significant for acute otitis of left ear, heart murmur, cough and COVID-19.  He presents today for [facility] virtual visit for nausea with vomiting.  Symptoms started approximately 6 hours after his last meal.  He ate takeout which included fried chicken.  He is unsure if that has contributed.  He admits to a few episodes of diarrhea as well.  He denies a fever.  Denies any respiratory concerns such as cough or congestion.  States he was driving in the car and all of a sudden developed stomach pain and immediately vomited.  Appears to feel better after he vomited.  Observations/Objective: Review of Systems  Gastrointestinal: Positive for nausea and vomiting.   Assessment and Plan:  Nausea with vomiting: -Foodborne illness versus viral illness given acute onset. -Recommend supportive measures with hydration, antiemetics and antidiarrheals as needed. -Prescription sent for Zofran 4 mg every 6-8 hours as needed for nausea. -Asked patient to pick up OTC antidiarrheals if needed. -If symptoms worsened or failed to improve patient to be seen in clinic at urgent care or emergency room.  Follow Up Instructions: -As needed.   I discussed the assessment and treatment plan with the patient. The patient was provided an opportunity to ask questions and all were answered. The patient agreed with the plan and demonstrated an understanding of the instructions.   The patient was advised to call back or seek an  in-person evaluation if the symptoms worsen or if the condition fails to improve as anticipated.  I provided 12 minutes of face-to-face time during this encounter.   Mauro Kaufmann, NP

## 2021-01-21 NOTE — Progress Notes (Signed)
Open by mistake  This encounter was created in error - please disregard.

## 2021-02-02 ENCOUNTER — Encounter: Payer: Self-pay | Admitting: Family Medicine

## 2021-02-02 DIAGNOSIS — L600 Ingrowing nail: Secondary | ICD-10-CM

## 2021-02-03 NOTE — Addendum Note (Signed)
Addended by: Margaretha Sheffield on: 02/03/2021 11:34 AM   Modules accepted: Orders

## 2021-02-03 NOTE — Telephone Encounter (Signed)
Podiatry referral please Utilize same when he went to before thank you

## 2021-02-13 ENCOUNTER — Other Ambulatory Visit: Payer: Self-pay

## 2021-02-13 ENCOUNTER — Encounter: Payer: Self-pay | Admitting: Podiatry

## 2021-02-13 ENCOUNTER — Ambulatory Visit (INDEPENDENT_AMBULATORY_CARE_PROVIDER_SITE_OTHER): Payer: No Typology Code available for payment source | Admitting: Podiatry

## 2021-02-13 DIAGNOSIS — L600 Ingrowing nail: Secondary | ICD-10-CM

## 2021-02-13 NOTE — Patient Instructions (Signed)

## 2021-02-16 NOTE — Progress Notes (Signed)
Subjective:   Patient ID: Tracy Riley, male   DOB: 20 y.o.   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  Educated him on surgery and what will be required and at this point I went ahead and he signed consent form and I then infiltrated the right hallux 60 mg Xylocaine Marcaine mixture removed the border after sterile prep and exposed matrix applied phenol 3 applications 30 seconds followed by alcohol lavage sterile dressing gave instructions on soaks and to leave dressing on 24 hours but take it off earlier if any throbbing were to occur and encouraged him to call with questions concerns

## 2021-04-10 ENCOUNTER — Encounter: Payer: Self-pay | Admitting: Family Medicine

## 2021-07-24 ENCOUNTER — Ambulatory Visit (HOSPITAL_COMMUNITY)
Admission: RE | Admit: 2021-07-24 | Discharge: 2021-07-24 | Disposition: A | Discharge status: Home / Self Care | Payer: No Typology Code available for payment source | Source: Ambulatory Visit | Attending: Family Medicine | Admitting: Family Medicine

## 2021-07-24 ENCOUNTER — Encounter: Payer: Self-pay | Admitting: Family Medicine

## 2021-07-24 ENCOUNTER — Ambulatory Visit (INDEPENDENT_AMBULATORY_CARE_PROVIDER_SITE_OTHER): Payer: No Typology Code available for payment source | Admitting: Family Medicine

## 2021-07-24 ENCOUNTER — Other Ambulatory Visit: Payer: Self-pay

## 2021-07-24 VITALS — BP 135/87 | Temp 97.5°F | Wt 257.8 lb

## 2021-07-24 DIAGNOSIS — K5900 Constipation, unspecified: Secondary | ICD-10-CM

## 2021-07-24 DIAGNOSIS — R197 Diarrhea, unspecified: Secondary | ICD-10-CM

## 2021-07-24 DIAGNOSIS — R1032 Left lower quadrant pain: Secondary | ICD-10-CM | POA: Insufficient documentation

## 2021-07-24 NOTE — Patient Instructions (Addendum)
Recommend   Miralax 1 cap in 8 oz of water Do this 2 times a day Also ducolax tablets otc use 2 tablets today to stimulate bm movement  To do labs today and the xray  We will touch base with you tomorrow

## 2021-07-24 NOTE — Progress Notes (Signed)
   Subjective:    Patient ID: Tracy Riley, male    DOB: 2001-03-14, 20 y.o.   MRN: [id number]  HPI Pt states that about 2-3 months ago he began using the bathroom around the clock. Pt states he was having diarrhea. Pt states he changed his diet 3 times with more protein and fiber. Pt states last Thursday he began to not be able to use the bathroom. Pt will eat but will not be hungry. Pt states he began to have pain Saturday night. . Pt states last BM was black. Pain comes and goes. Laxative made the pain worse.   Was very watery with no mucous or blood Then began increased frequency Several times a day Week ago no BMs Was diarrhea the last BM  Recently reduced sweets and soda Avoid red meat   Review of Systems     Objective:   Physical Exam  Lungs clear heart regular abdomen is soft subjective achy sensation left side rectal exam no impaction heme-negative      Assessment & Plan:   Recent diarrhea issues Now with constipation issues Could be IBS Need to do lab work and KUB Also MiraLAX along with Duca locks for today May need GI referral regarding frequent loose stools Given that no blood has been seen doubt colitis

## 2021-07-25 LAB — HEPATIC FUNCTION PANEL
ALT: 19 IU/L (ref 0–44)
AST: 16 IU/L (ref 0–40)
Albumin: 5 g/dL (ref 4.1–5.2)
Alkaline Phosphatase: 124 IU/L — ABNORMAL HIGH (ref 44–121)
Bilirubin Total: 0.6 mg/dL (ref 0.0–1.2)
Bilirubin, Direct: 0.15 mg/dL (ref 0.00–0.40)
Total Protein: 7.5 g/dL (ref 6.0–8.5)

## 2021-07-25 LAB — CBC WITH DIFFERENTIAL/PLATELET
Basophils Absolute: 0 10*3/uL (ref 0.0–0.2)
Basos: 1 %
EOS (ABSOLUTE): 0.3 10*3/uL (ref 0.0–0.4)
Eos: 4 %
Hematocrit: 46.6 % (ref 37.5–51.0)
Hemoglobin: 15.7 g/dL (ref 13.0–17.7)
Immature Grans (Abs): 0 10*3/uL (ref 0.0–0.1)
Immature Granulocytes: 0 %
Lymphocytes Absolute: 1.6 10*3/uL (ref 0.7–3.1)
Lymphs: 28 %
MCH: 28.1 pg (ref 26.6–33.0)
MCHC: 33.7 g/dL (ref 31.5–35.7)
MCV: 83 fL (ref 79–97)
Monocytes Absolute: 0.5 10*3/uL (ref 0.1–0.9)
Monocytes: 8 %
Neutrophils Absolute: 3.4 10*3/uL (ref 1.4–7.0)
Neutrophils: 59 %
Platelets: 204 10*3/uL (ref 150–450)
RBC: 5.59 x10E6/uL (ref 4.14–5.80)
RDW: 12.6 % (ref 11.6–15.4)
WBC: 5.7 10*3/uL (ref 3.4–10.8)

## 2021-07-25 LAB — BASIC METABOLIC PANEL
BUN/Creatinine Ratio: 10 (ref 9–20)
BUN: 10 mg/dL (ref 6–20)
CO2: 24 mmol/L (ref 20–29)
Calcium: 10 mg/dL (ref 8.7–10.2)
Chloride: 99 mmol/L (ref 96–106)
Creatinine, Ser: 1 mg/dL (ref 0.76–1.27)
Glucose: 80 mg/dL (ref 65–99)
Potassium: 4.5 mmol/L (ref 3.5–5.2)
Sodium: 138 mmol/L (ref 134–144)
eGFR: 110 mL/min/{1.73_m2} (ref >59)

## 2021-07-25 LAB — LIPASE: Lipase: 13 U/L (ref 13–78)

## 2021-07-25 NOTE — Addendum Note (Signed)
Addended by: Margaretha Sheffield on: 07/25/2021 04:41 PM   Modules accepted: Orders

## 2021-07-29 ENCOUNTER — Telehealth: Payer: Self-pay | Admitting: Family Medicine

## 2021-07-29 DIAGNOSIS — R1032 Left lower quadrant pain: Secondary | ICD-10-CM

## 2021-07-29 DIAGNOSIS — K5900 Constipation, unspecified: Secondary | ICD-10-CM

## 2021-07-29 DIAGNOSIS — R197 Diarrhea, unspecified: Secondary | ICD-10-CM

## 2021-07-29 NOTE — Telephone Encounter (Signed)
Pt states he has had one BM on Sunday; diarrhea form. Pt felt better afterwards but yesterday felt the need to go but not able to go No vomiting but had nausea this morning. Pt has put on more weight and "stomach has began to grow" Doing miralax since Thursday night BID. Please advise. Thank you

## 2021-07-29 NOTE — Telephone Encounter (Signed)
Patient left voicemail message to request call back from nurse; prescribed med not resolving issue. Patient stated he has only had one movement as of today (diarrhea) and is experiencing abdominal pain. Please advise at [phone].

## 2021-07-29 NOTE — Telephone Encounter (Signed)
Pt contacted and verbalized understanding. Referral to GI placed as urgent and message sent to referral coordinator.

## 2021-07-29 NOTE — Telephone Encounter (Signed)
I would continue the MiraLAX twice daily.  I do not feel that he is blocked up.  But should he start developing significant abdominal pain vomiting or any other worrisome symptoms he should be rechecked here or ER this week otherwise he may take Duca locks daily as needed to promote bowel movements Please have referrals try to make his GI consult urgent Also patient may do a follow-up visit next week if not improving

## 2021-07-31 ENCOUNTER — Encounter (INDEPENDENT_AMBULATORY_CARE_PROVIDER_SITE_OTHER): Payer: Self-pay | Admitting: *Deleted

## 2021-08-25 ENCOUNTER — Encounter: Payer: Self-pay | Admitting: Family Medicine

## 2021-08-25 ENCOUNTER — Telehealth: Payer: Self-pay | Admitting: Family Medicine

## 2021-08-25 NOTE — Telephone Encounter (Signed)
Follow-up in late October would be fine sooner if any problems

## 2021-08-25 NOTE — Telephone Encounter (Addendum)
See phone message 08/25/21: Patient had an appointment scheduled for him through MyChart on 10/4. Cannot keep appointment and was unable to get scheduled in the next few weeks. He said he would wait and call back in a couple of weeks but if Dr. Lorin Picket needed to see him earlier to let him know. Stated he wanted to wait until March to have knee surgery.

## 2021-08-25 NOTE — Telephone Encounter (Signed)
FYI  Patient had an appointment scheduled for him through MyChart on 10/4. Cannot keep appointment and was unable to get scheduled in the next few weeks. He said he would wait and call back in a couple of weeks but if Dr. Lorin Picket needed to see him earlier to let him know. Stated he wanted to wait until March to have knee surgery.  CB# [phone]

## 2021-08-26 ENCOUNTER — Ambulatory Visit: Payer: No Typology Code available for payment source | Admitting: Family Medicine

## 2021-08-28 NOTE — Telephone Encounter (Signed)
I have left vm for patient to return my call so we can get him scheduled.  CB# [phone]

## 2021-10-10 ENCOUNTER — Encounter: Payer: Self-pay | Admitting: Family Medicine

## 2021-10-10 NOTE — Telephone Encounter (Signed)
Nurses  I would recommend Robitussin-DM or other similar measures If any ongoing troubles or not doing better by early next week have Ian Malkin reconnect with [place] and we can work him in with 1 of [place] providers sooner if any problems thank you

## 2021-10-27 ENCOUNTER — Encounter: Payer: Self-pay | Admitting: Family Medicine

## 2021-10-27 ENCOUNTER — Ambulatory Visit (INDEPENDENT_AMBULATORY_CARE_PROVIDER_SITE_OTHER): Payer: No Typology Code available for payment source | Admitting: Family Medicine

## 2021-10-27 ENCOUNTER — Other Ambulatory Visit: Payer: Self-pay

## 2021-10-27 VITALS — BP 118/72 | Temp 97.9°F | Ht 74.0 in | Wt 262.0 lb

## 2021-10-27 DIAGNOSIS — Z0000 Encounter for general adult medical examination without abnormal findings: Secondary | ICD-10-CM

## 2021-10-27 NOTE — Progress Notes (Signed)
   Subjective:    Patient ID: Tracy Riley, male    DOB: November 13, 2001, 20 y.o.   MRN: [id number]  HPI The patient comes in today for a wellness visit.  Annual wellness-he is trying to do a good job of eating healthy staying active denies any major setbacks.  Denies depression.  Does not smoke does not drink not sexually active tries to be safe and is driving  A review of their health history was completed.  A review of medications was also completed.  Any needed refills; no  Eating habits: good  Falls/  MVA accidents in past few months: no  Regular exercise: active lifestyle  Specialist pt sees on regular basis: chiropractor, [facility]  Preventative health issues were discussed.   Additional concerns: none Flowsheet Row Office Visit from 10/27/2021 in [facility]  PHQ-2 Total Score 0       No etoh,vaping,smioking,neg depression Review of Systems     Objective:   Physical Exam General-in no acute distress Eyes-no discharge Lungs-respiratory rate normal, CTA CV-no murmurs,RRR Extremities skin warm dry no edema Neuro grossly normal Behavior normal, alert        Assessment & Plan:   Adult wellness-complete.wellness physical was conducted today. Importance of diet and exercise were discussed in detail.  In addition to this a discussion regarding safety was also covered. We also reviewed over immunizations and gave recommendations regarding current immunization needed for age.  In addition to this additional areas were also touched on including: Preventative health exams needed:  Colonoscopy not indicated  Patient was advised yearly wellness exam

## 2021-10-27 NOTE — Patient Instructions (Signed)
Mediterranean Diet A Mediterranean diet refers to food and lifestyle choices that are based on the traditions of countries located on the [employer]. It focuses on eating more fruits, vegetables, whole grains, beans, nuts, seeds, and heart-healthy fats, and eating less dairy, meat, eggs, and processed foods with added sugar, salt, and fat. This way of eating has been shown to help prevent certain conditions and improve outcomes for people who have chronic diseases, like kidney disease and heart disease. What are tips for following this plan? Reading food labels Check the serving size of packaged foods. For foods such as rice and pasta, the serving size refers to the amount of cooked product, not dry. Check the total fat in packaged foods. Avoid foods that have saturated fat or trans fats. Check the ingredient list for added sugars, such as corn syrup. Shopping  Buy a variety of foods that offer a balanced diet, including: Fresh fruits and vegetables (produce). Grains, beans, nuts, and seeds. Some of these may be available in unpackaged forms or large amounts (in bulk). Fresh seafood. Poultry and eggs. Low-fat dairy products. Buy whole ingredients instead of prepackaged foods. Buy fresh fruits and vegetables in-season from local farmers markets. Buy plain frozen fruits and vegetables. If you do not have access to quality fresh seafood, buy precooked frozen shrimp or canned fish, such as tuna, salmon, or sardines. Stock your pantry so you always have certain foods on hand, such as olive oil, canned tuna, canned tomatoes, rice, pasta, and beans. Cooking Cook foods with extra-virgin olive oil instead of using butter or other vegetable oils. Have meat as a side dish, and have vegetables or grains as your main dish. This means having meat in small portions or adding small amounts of meat to foods like pasta or stew. Use beans or vegetables instead of meat in common dishes like chili or  lasagna. Experiment with different cooking methods. Try roasting, broiling, steaming, and sauting vegetables. Add frozen vegetables to soups, stews, pasta, or rice. Add nuts or seeds for added healthy fats and plant protein at each meal. You can add these to yogurt, salads, or vegetable dishes. Marinate fish or vegetables using olive oil, lemon juice, garlic, and fresh herbs. Meal planning Plan to eat one vegetarian meal one day each week. Try to work up to two vegetarian meals, if possible. Eat seafood two or more times a week. Have healthy snacks readily available, such as: Vegetable sticks with hummus. Greek yogurt. Fruit and nut trail mix. Eat balanced meals throughout the week. This includes: Fruit: 2-3 servings a day. Vegetables: 4-5 servings a day. Low-fat dairy: 2 servings a day. Fish, poultry, or lean meat: 1 serving a day. Beans and legumes: 2 or more servings a week. Nuts and seeds: 1-2 servings a day. Whole grains: 6-8 servings a day. Extra-virgin olive oil: 3-4 servings a day. Limit red meat and sweets to only a few servings a month. Lifestyle  Cook and eat meals together with your family, when possible. Drink enough fluid to keep your urine pale yellow. Be physically active every day. This includes: Aerobic exercise like running or swimming. Leisure activities like gardening, walking, or housework. Get 7-8 hours of sleep each night. If recommended by your health care provider, drink red wine in moderation. This means 1 glass a day for nonpregnant women and 2 glasses a day for men. A glass of wine equals 5 oz (150 mL). What foods should I eat? Fruits Apples. Apricots. Avocado. Berries. Bananas. Cherries. Dates.  Figs. Grapes. Lemons. Melon. Oranges. Peaches. Plums. Pomegranate. Vegetables Artichokes. Beets. Broccoli. Cabbage. Carrots. Eggplant. Green beans. Chard. Kale. Spinach. Onions. Leeks. Peas. Squash. Tomatoes. Peppers. Radishes. Grains Whole-grain pasta. Brown  rice. Bulgur wheat. Polenta. Couscous. Whole-wheat bread. Orpah Cobb. Meats and other proteins Beans. Almonds. Sunflower seeds. Pine nuts. Peanuts. Cod. Salmon. Scallops. Shrimp. Tuna. Tilapia. Clams. Oysters. Eggs. Poultry without skin. Dairy Low-fat milk. Cheese. Greek yogurt. Fats and oils Extra-virgin olive oil. Avocado oil. Grapeseed oil. Beverages Water. Red wine. Herbal tea. Sweets and desserts Greek yogurt with honey. Baked apples. Poached pears. Trail mix. Seasonings and condiments Basil. Cilantro. Coriander. Cumin. Mint. Parsley. Sage. Rosemary. Tarragon. Garlic. Oregano. Thyme. Pepper. Balsamic vinegar. Tahini. Hummus. Tomato sauce. Olives. Mushrooms. The items listed above may not be a complete list of foods and beverages you can eat. Contact a dietitian for more information. What foods should I limit? This is a list of foods that should be eaten rarely or only on special occasions. Fruits Fruit canned in syrup. Vegetables Deep-fried potatoes (french fries). Grains Prepackaged pasta or rice dishes. Prepackaged cereal with added sugar. Prepackaged snacks with added sugar. Meats and other proteins Beef. Pork. Lamb. Poultry with skin. Hot dogs. Tomasa Blase. Dairy Ice cream. Sour cream. Whole milk. Fats and oils Butter. Canola oil. Vegetable oil. Beef fat (tallow). Lard. Beverages Juice. Sugar-sweetened soft drinks. Beer. Liquor and spirits. Sweets and desserts Cookies. Cakes. Pies. Candy. Seasonings and condiments Mayonnaise. Pre-made sauces and marinades. The items listed above may not be a complete list of foods and beverages you should limit. Contact a dietitian for more information. Summary The Mediterranean diet includes both food and lifestyle choices. Eat a variety of fresh fruits and vegetables, beans, nuts, seeds, and whole grains. Limit the amount of red meat and sweets that you eat. If recommended by your health care provider, drink red wine in moderation.  This means 1 glass a day for nonpregnant women and 2 glasses a day for men. A glass of wine equals 5 oz (150 mL). This information is not intended to replace advice given to you by your health care provider. Make sure you discuss any questions you have with your health care provider. Document Revised: 12/15/2019 Document Reviewed: 10/12/2019 Elsevier Patient Education  2022 [employer]. Preventive Care 39-39 Years Old, Male Preventive care refers to lifestyle choices and visits with your health care provider that can promote health and wellness. Preventive care visits are also called wellness exams. What can I expect for my preventive care visit? Counseling During your preventive care visit, your health care provider may ask about your: Medical history, including: Past medical problems. Family medical history. Current health, including: Emotional well-being. Home life and relationship well-being. Sexual activity. Lifestyle, including: Alcohol, nicotine or tobacco, and drug use. Access to firearms. Diet, exercise, and sleep habits. Safety issues such as seatbelt and bike helmet use. Sunscreen use. Work and work Astronomer. Physical exam Your health care provider may check your: Height and weight. These may be used to calculate your BMI (body mass index). BMI is a measurement that tells if you are at a healthy weight. Waist circumference. This measures the distance around your waistline. This measurement also tells if you are at a healthy weight and may help predict your risk of certain diseases, such as type 2 diabetes and high blood pressure. Heart rate and blood pressure. Body temperature. Skin for abnormal spots. What immunizations do I need? Vaccines are usually given at various ages, according to a schedule. Your health care  provider will recommend vaccines for you based on your age, medical history, and lifestyle or other factors, such as travel or where you work. What tests  do I need? Screening Your health care provider may recommend screening tests for certain conditions. This may include: Lipid and cholesterol levels. Diabetes screening. This is done by checking your blood sugar (glucose) after you have not eaten for a while (fasting). Hepatitis B test. Hepatitis C test. HIV (human immunodeficiency virus) test. STI (sexually transmitted infection) testing, if you are at risk. Talk with your health care provider about your test results, treatment options, and if necessary, the need for more tests. Follow these instructions at home: Eating and drinking  Eat a healthy diet that includes fresh fruits and vegetables, whole grains, lean protein, and low-fat dairy products. Drink enough fluid to keep your urine pale yellow. Take vitamin and mineral supplements as recommended by your health care provider. Do not drink alcohol if your health care provider tells you not to drink. If you drink alcohol: Limit how much you have to 0-2 drinks a day. Know how much alcohol is in your drink. In the [place] one drink equals one 12 oz bottle of beer (355 mL), one 5 oz glass of wine (148 mL), or one 1 oz glass of hard liquor (44 mL). Lifestyle Brush your teeth every morning and night with fluoride toothpaste. Floss one time each day. Exercise for at least 30 minutes 5 or more days each week. Do not use any products that contain nicotine or tobacco. These products include cigarettes, chewing tobacco, and vaping devices, such as e-cigarettes. If you need help quitting, ask your health care provider. Do not use drugs. If you are sexually active, practice safe sex. Use a condom or other form of protection to prevent STIs. Find healthy ways to manage stress, such as: Meditation, yoga, or listening to music. Journaling. Talking to a trusted person. Spending time with friends and family. Minimize exposure to UV radiation to reduce your risk of skin cancer. Safety Always wear  your seat belt while driving or riding in a vehicle. Do not drive: If you have been drinking alcohol. Do not ride with someone who has been drinking. If you have been using any mind-altering substances or drugs. While texting. When you are tired or distracted. Wear a helmet and other protective equipment during sports activities. If you have firearms in your house, make sure you follow all gun safety procedures. Seek help if you have been physically or sexually abused. What's next? Go to your health care provider once a year for an annual wellness visit. Ask your health care provider how often you should have your eyes and teeth checked. Stay up to date on all vaccines. This information is not intended to replace advice given to you by your health care provider. Make sure you discuss any questions you have with your health care provider. Document Revised: 05/07/2021 Document Reviewed: 05/07/2021 Elsevier Patient Education  2022 [employer].

## 2021-12-01 ENCOUNTER — Ambulatory Visit (INDEPENDENT_AMBULATORY_CARE_PROVIDER_SITE_OTHER): Payer: No Typology Code available for payment source | Admitting: Gastroenterology

## 2021-12-08 ENCOUNTER — Telehealth: Payer: Self-pay | Admitting: Family Medicine

## 2021-12-08 NOTE — Telephone Encounter (Signed)
FYI- patient left message stating he had sinus infection for two weeks tried to give him appointment at 2:40 side door he declined.

## 2022-03-17 ENCOUNTER — Ambulatory Visit: Payer: No Typology Code available for payment source | Admitting: Family Medicine

## 2022-04-01 ENCOUNTER — Ambulatory Visit (INDEPENDENT_AMBULATORY_CARE_PROVIDER_SITE_OTHER): Payer: No Typology Code available for payment source | Admitting: Family Medicine

## 2022-04-01 ENCOUNTER — Encounter: Payer: Self-pay | Admitting: Family Medicine

## 2022-04-01 VITALS — BP 134/89 | HR 94 | Temp 98.1°F | Ht 74.0 in | Wt 263.6 lb

## 2022-04-01 DIAGNOSIS — R030 Elevated blood-pressure reading, without diagnosis of hypertension: Secondary | ICD-10-CM | POA: Diagnosis not present

## 2022-04-01 NOTE — Progress Notes (Signed)
? ?  Subjective:  ? ? Patient ID: Tracy Riley, male    DOB: 04/26/2001, 21 y.o.   MRN: [id number] ? ?HPI ? ?Patient being seen for high blood pressure. Gets migraine every now and then ?Intermittent spells with blood pressure being elevated ?Not having any chest pain or shortness of breath ?Tries to eat relatively healthy stays active as a wrestling referee but does not do any type of cardio exercise on a regular basis.  Does have a family history of blood pressure issues.  Patient has checked blood pressure multiple times outside the office elevated 140 over low 90s ?Review of Systems ? ?   ?Objective:  ? Physical Exam ? ?General-in no acute distress ?Eyes-no discharge ?Lungs-respiratory rate normal, CTA ?CV-no murmurs,RRR ?Extremities skin warm dry no edema ?Neuro grossly normal ?Behavior normal, alert ? ?Lab work on follow-up ? ?   ?Assessment & Plan:  ?Blood pressure on recheck borderline ?Healthy diet recommended ?Regular cardio exercise such as brisk walking 4-5 times per week recommended ?Discussed the possibility of starting medication versus patient working hard on lifestyle issues and following up in 3 months ?Patient would prefer to follow-up in 3 months ideally we would like to see blood pressure 130/80 or less if not doing better strongly consider medications ? ?

## 2022-04-01 NOTE — Patient Instructions (Signed)
Managing Your High Blood Pressure ?For the person with high blood pressure, learn what blood pressure is, what the numbers mean, and what you can do to help keep your blood pressure in a normal range.  ?To view the content, go to this web address: ?[email or web address] ? ?This video will expire on: 02/10/2024. If you need access to this video following this date, please reach out to the healthcare provider who assigned it to you. ?This information is not intended to replace advice given to you by your health care provider. Make sure you discuss any questions you have with your health care provider. ?Elsevier Patient Education ? 2023 Elsevier Inc. ?DASH Eating Plan ?DASH stands for Dietary Approaches to Stop Hypertension. The DASH eating plan is a healthy eating plan that has been shown to: ?Reduce high blood pressure (hypertension). ?Reduce your risk for type 2 diabetes, heart disease, and stroke. ?Help with weight loss. ?What are tips for following this plan? ?Reading food labels ?Check food labels for the amount of salt (sodium) per serving. Choose foods with less than 5 percent of the Daily Value of sodium. Generally, foods with less than 300 milligrams (mg) of sodium per serving fit into this eating plan. ?To find whole grains, look for the word "whole" as the first word in the ingredient list. ?Shopping ?Buy products labeled as "low-sodium" or "no salt added." ?Buy fresh foods. Avoid canned foods and pre-made or frozen meals. ?Cooking ?Avoid adding salt when cooking. Use salt-free seasonings or herbs instead of table salt or sea salt. Check with your health care provider or pharmacist before using salt substitutes. ?Do not fry foods. Cook foods using healthy methods such as baking, boiling, grilling, roasting, and broiling instead. ?Cook with heart-healthy oils, such as olive, canola, avocado, soybean, or sunflower oil. ?Meal planning ? ?Eat a balanced diet that includes: ?4 or more servings of  fruits and 4 or more servings of vegetables each day. Try to fill one-half of your plate with fruits and vegetables. ?6-8 servings of whole grains each day. ?Less than 6 oz (170 g) of lean meat, poultry, or fish each day. A 3-oz (85-g) serving of meat is about the same size as a deck of cards. One egg equals 1 oz (28 g). ?2-3 servings of low-fat dairy each day. One serving is 1 cup (237 mL). ?1 serving of nuts, seeds, or beans 5 times each week. ?2-3 servings of heart-healthy fats. Healthy fats called omega-3 fatty acids are found in foods such as walnuts, flaxseeds, fortified milks, and eggs. These fats are also found in cold-water fish, such as sardines, salmon, and mackerel. ?Limit how much you eat of: ?Canned or prepackaged foods. ?Food that is high in trans fat, such as some fried foods. ?Food that is high in saturated fat, such as fatty meat. ?Desserts and other sweets, sugary drinks, and other foods with added sugar. ?Full-fat dairy products. ?Do not salt foods before eating. ?Do not eat more than 4 egg yolks a week. ?Try to eat at least 2 vegetarian meals a week. ?Eat more home-cooked food and less restaurant, buffet, and fast food. ?Lifestyle ?When eating at a restaurant, ask that your food be prepared with less salt or no salt, if possible. ?If you drink alcohol: ?Limit how much you use to: ?0-1 drink a day for women who are not pregnant. ?0-2 drinks a day for men. ?Be aware of how much alcohol is in your drink. In the [place] one drink equals one 12 oz bottle  of beer (355 mL), one 5 oz glass of wine (148 mL), or one 1? oz glass of hard liquor (44 mL). ?General information ?Avoid eating more than 2,300 mg of salt a day. If you have hypertension, you may need to reduce your sodium intake to 1,500 mg a day. ?Work with your health care provider to maintain a healthy body weight or to lose weight. Ask what an ideal weight is for you. ?Get at least 30 minutes of exercise that causes your heart to beat faster  (aerobic exercise) most days of the week. Activities may include walking, swimming, or biking. ?Work with your health care provider or dietitian to adjust your eating plan to your individual calorie needs. ?What foods should I eat? ?Fruits ?All fresh, dried, or frozen fruit. Canned fruit in natural juice (without added sugar). ?Vegetables ?Fresh or frozen vegetables (raw, steamed, roasted, or grilled). Low-sodium or reduced-sodium tomato and vegetable juice. Low-sodium or reduced-sodium tomato sauce and tomato paste. Low-sodium or reduced-sodium canned vegetables. ?Grains ?Whole-grain or whole-wheat bread. Whole-grain or whole-wheat pasta. Brown rice. Orpah Cobb. Bulgur. Whole-grain and low-sodium cereals. Pita bread. Low-fat, low-sodium crackers. Whole-wheat flour tortillas. ?Meats and other proteins ?Skinless chicken or [place]. Ground chicken or [place]. Pork with fat trimmed off. Fish and seafood. Egg whites. Dried beans, peas, or lentils. Unsalted nuts, nut butters, and seeds. Unsalted canned beans. Lean cuts of beef with fat trimmed off. Low-sodium, lean precooked or cured meat, such as sausages or meat loaves. ?Dairy ?Low-fat (1%) or fat-free (skim) milk. Reduced-fat, low-fat, or fat-free cheeses. Nonfat, low-sodium ricotta or cottage cheese. Low-fat or nonfat yogurt. Low-fat, low-sodium cheese. ?Fats and oils ?Soft margarine without trans fats. Vegetable oil. Reduced-fat, low-fat, or light mayonnaise and salad dressings (reduced-sodium). Canola, safflower, olive, avocado, soybean, and sunflower oils. Avocado. ?Seasonings and condiments ?Herbs. Spices. Seasoning mixes without salt. ?Other foods ?Unsalted popcorn and pretzels. Fat-free sweets. ?The items listed above may not be a complete list of foods and beverages you can eat. Contact a dietitian for more information. ?What foods should I avoid? ?Fruits ?Canned fruit in a light or heavy syrup. Fried fruit. Fruit in cream or butter  sauce. ?Vegetables ?Creamed or fried vegetables. Vegetables in a cheese sauce. Regular canned vegetables (not low-sodium or reduced-sodium). Regular canned tomato sauce and paste (not low-sodium or reduced-sodium). Regular tomato and vegetable juice (not low-sodium or reduced-sodium). Rosita Fire. Olives. ?Grains ?Baked goods made with fat, such as croissants, muffins, or some breads. Dry pasta or rice meal packs. ?Meats and other proteins ?Fatty cuts of meat. Ribs. Fried meat. Tomasa Blase. Bologna, salami, and other precooked or cured meats, such as sausages or meat loaves. Fat from the back of a pig (fatback). Bratwurst. Salted nuts and seeds. Canned beans with added salt. Canned or smoked fish. Whole eggs or egg yolks. Chicken or [place] with skin. ?Dairy ?Whole or 2% milk, cream, and half-and-half. Whole or full-fat cream cheese. Whole-fat or sweetened yogurt. Full-fat cheese. Nondairy creamers. Whipped toppings. Processed cheese and cheese spreads. ?Fats and oils ?Butter. Stick margarine. Lard. Shortening. Ghee. Bacon fat. Tropical oils, such as coconut, palm kernel, or palm oil. ?Seasonings and condiments ?Onion salt, garlic salt, seasoned salt, table salt, and sea salt. Worcestershire sauce. Tartar sauce. Barbecue sauce. Teriyaki sauce. Soy sauce, including reduced-sodium. Steak sauce. Canned and packaged gravies. Fish sauce. Oyster sauce. Cocktail sauce. Store-bought horseradish. Ketchup. Mustard. Meat flavorings and tenderizers. Bouillon cubes. Hot sauces. Pre-made or packaged marinades. Pre-made or packaged taco seasonings. Relishes. Regular salad dressings. ?Other foods ?Salted  popcorn and pretzels. ?The items listed above may not be a complete list of foods and beverages you should avoid. Contact a dietitian for more information. ?Where to find more information ?National Heart, Lung, and Blood Institute: PopSteam.is ?American Heart Association: www.heart.org ?Academy of Nutrition and Dietetics:  www.eatright.org ?National Kidney Foundation: www.kidney.org ?Summary ?The DASH eating plan is a healthy eating plan that has been shown to reduce high blood pressure (hypertension). It may also reduce your risk for type 2 diabetes, heart disease,

## 2022-05-06 ENCOUNTER — Encounter: Payer: Self-pay | Admitting: Family Medicine

## 2022-05-06 ENCOUNTER — Ambulatory Visit (INDEPENDENT_AMBULATORY_CARE_PROVIDER_SITE_OTHER): Payer: No Typology Code available for payment source | Admitting: Family Medicine

## 2022-05-06 VITALS — BP 122/74 | HR 71 | Temp 99.7°F | Wt 263.0 lb

## 2022-05-06 DIAGNOSIS — J020 Streptococcal pharyngitis: Secondary | ICD-10-CM | POA: Diagnosis not present

## 2022-05-06 DIAGNOSIS — R0989 Other specified symptoms and signs involving the circulatory and respiratory systems: Secondary | ICD-10-CM

## 2022-05-06 LAB — POCT RAPID STREP A (OFFICE): Rapid Strep A Screen: POSITIVE — AB

## 2022-05-06 MED ORDER — ONDANSETRON 4 MG PO TBDP: 4.0000 mg | ORAL_TABLET | Freq: Three times a day (TID) | ORAL | 0 refills | Status: DC | PRN

## 2022-05-06 MED ORDER — AMOXICILLIN 875 MG PO TABS: 875.0000 mg | ORAL_TABLET | Freq: Two times a day (BID) | ORAL | 0 refills | Status: AC

## 2022-05-07 ENCOUNTER — Telehealth: Payer: Self-pay | Admitting: Family Medicine

## 2022-05-07 DIAGNOSIS — J020 Streptococcal pharyngitis: Secondary | ICD-10-CM | POA: Insufficient documentation

## 2022-05-07 NOTE — Progress Notes (Signed)
Subjective:  Patient ID: Tracy Riley, male    DOB: Mar 29, 2001  Age: 21 y.o. MRN: [id number]  CC: Chief Complaint  Patient presents with   Fever    Weakness, tonsil pain, drowsy, headache, left ear pain, nose bleeds (stopped at 12 today), kidney pain (pt states he knows he is dehydrated), not able to sleep well. Fever of 100.7 (broke from 2a-10a)    HPI:  21 year old male presents for evaluation of the above.  Patient reports that he did not feel well on Sunday.  On Monday he developed fever, sore throat and associated ear pain.  Patient's most recent fever was 100.7.  He has had associated nausea.  He states that he feels dehydrated as he has not drank as much as he normally does.  He has taken Aleve, Mucinex, and some leftover amoxicillin.  Patient also reports that he has had body aches.  No relieving factors.  No reported sick contacts.  No other complaints.  Patient Active Problem List   Diagnosis Date Noted   Strep pharyngitis 05/07/2022   Thoracic outlet syndrome 03/06/2020   Faint heart murmur 05/31/2014   Learning disability 05/07/2014    Social Hx   Social History   Socioeconomic History   Marital status: Single    Spouse name: Not on file   Number of children: Not on file   Years of education: Not on file   Highest education level: Not on file  Occupational History   Not on file  Tobacco Use   Smoking status: Never   Smokeless tobacco: Never  Substance and Sexual Activity   Alcohol use: No   Drug use: No   Sexual activity: Not on file  Other Topics Concern   Not on file  Social History Narrative   Not on file   Social Determinants of Health   Financial Resource Strain: Not on file  Food Insecurity: Not on file  Transportation Needs: Not on file  Physical Activity: Not on file  Stress: Not on file  Social Connections: Not on file    Review of Systems Per HPI  Objective:  BP 122/74   Pulse 71   Temp 99.7 F (37.6 C)   Wt 263 lb (119.3  kg)   SpO2 100%   BMI 33.77 kg/m      05/06/2022    4:14 PM 04/01/2022    7:55 AM 10/27/2021    1:35 PM  BP/Weight  Systolic BP 122 134 118  Diastolic BP 74 89 72  Wt. (Lbs) 263 263.6 262  BMI 33.77 kg/m2 33.84 kg/m2 33.64 kg/m2    Physical Exam Vitals and nursing note reviewed.  Constitutional:      General: He is not in acute distress. HENT:     Head: Normocephalic and atraumatic.     Mouth/Throat:     Pharynx: Oropharyngeal exudate and posterior oropharyngeal erythema present.  Cardiovascular:     Rate and Rhythm: Normal rate and regular rhythm.  Pulmonary:     Effort: Pulmonary effort is normal.     Breath sounds: Normal breath sounds. No wheezing, rhonchi or rales.  Musculoskeletal:     Cervical back: Neck supple.  Lymphadenopathy:     Cervical: Cervical adenopathy present.  Neurological:     Mental Status: He is alert.     Lab Results  Component Value Date   WBC 5.7 07/24/2021   HGB 15.7 07/24/2021   HCT 46.6 07/24/2021   PLT 204 07/24/2021   GLUCOSE 80  07/24/2021   CHOL 150 04/12/2020   TRIG 133 04/12/2020   HDL 42 04/12/2020   LDLCALC 84 04/12/2020   ALT 19 07/24/2021   AST 16 07/24/2021   NA 138 07/24/2021   K 4.5 07/24/2021   CL 99 07/24/2021   CREATININE 1.00 07/24/2021   BUN 10 07/24/2021   CO2 24 07/24/2021     Assessment & Plan:   Problem List Items Addressed This Visit       Respiratory   Strep pharyngitis - Primary    Acute illness with systemic symptoms given ongoing fever.  Rapid strep positive.  Treating with amoxicillin.      Relevant Medications   amoxicillin (AMOXIL) 875 MG tablet   Other Relevant Orders   POCT rapid strep A (Completed)    Meds ordered this encounter  Medications   amoxicillin (AMOXIL) 875 MG tablet    Sig: Take 1 tablet (875 mg total) by mouth 2 (two) times daily for 10 days.    Dispense:  20 tablet    Refill:  0   ondansetron (ZOFRAN-ODT) 4 MG disintegrating tablet    Sig: Take 1 tablet (4 mg  total) by mouth every 8 (eight) hours as needed for nausea or vomiting.    Dispense:  20 tablet    Refill:  0    Raequon Catanzaro DO [facility]

## 2022-05-07 NOTE — Assessment & Plan Note (Signed)
Acute illness with systemic symptoms given ongoing fever.  Rapid strep positive.  Treating with amoxicillin.

## 2022-05-07 NOTE — Telephone Encounter (Signed)
Work note sent to patient.  Everlene Other DO [facility]

## 2022-05-12 ENCOUNTER — Encounter: Payer: Self-pay | Admitting: *Deleted

## 2022-05-15 ENCOUNTER — Encounter: Payer: Self-pay | Admitting: Diagnostic Neuroimaging

## 2022-05-15 ENCOUNTER — Ambulatory Visit (INDEPENDENT_AMBULATORY_CARE_PROVIDER_SITE_OTHER): Payer: No Typology Code available for payment source | Admitting: Diagnostic Neuroimaging

## 2022-05-15 VITALS — BP 124/82 | HR 92 | Ht 74.0 in | Wt 260.0 lb

## 2022-05-15 DIAGNOSIS — R202 Paresthesia of skin: Secondary | ICD-10-CM | POA: Diagnosis not present

## 2022-05-15 DIAGNOSIS — G8929 Other chronic pain: Secondary | ICD-10-CM | POA: Diagnosis not present

## 2022-05-15 DIAGNOSIS — M25512 Pain in left shoulder: Secondary | ICD-10-CM

## 2022-05-15 DIAGNOSIS — R200 Anesthesia of skin: Secondary | ICD-10-CM | POA: Diagnosis not present

## 2022-05-27 ENCOUNTER — Encounter: Payer: Self-pay | Admitting: Diagnostic Neuroimaging

## 2022-05-28 ENCOUNTER — Other Ambulatory Visit: Payer: Self-pay | Admitting: Neurology

## 2022-05-28 DIAGNOSIS — G8929 Other chronic pain: Secondary | ICD-10-CM

## 2022-06-03 ENCOUNTER — Telehealth: Payer: Self-pay | Admitting: Neurology

## 2022-06-03 NOTE — Telephone Encounter (Signed)
Referral for Physical Therapy sent to [facility] [phone]. 

## 2022-06-10 ENCOUNTER — Ambulatory Visit: Payer: No Typology Code available for payment source | Admitting: Diagnostic Neuroimaging

## 2022-07-02 ENCOUNTER — Ambulatory Visit: Payer: Self-pay | Admitting: Family Medicine

## 2022-09-08 ENCOUNTER — Telehealth: Payer: Self-pay | Admitting: Family Medicine

## 2022-09-08 DIAGNOSIS — Z1159 Encounter for screening for other viral diseases: Secondary | ICD-10-CM

## 2022-09-08 DIAGNOSIS — Z131 Encounter for screening for diabetes mellitus: Secondary | ICD-10-CM

## 2022-09-08 DIAGNOSIS — Z13220 Encounter for screening for lipoid disorders: Secondary | ICD-10-CM

## 2022-09-08 DIAGNOSIS — Z79899 Other long term (current) drug therapy: Secondary | ICD-10-CM

## 2022-09-08 NOTE — Telephone Encounter (Signed)
Patient needs lab for physical in december

## 2022-09-09 NOTE — Telephone Encounter (Signed)
Lipid, glucose, hepatitis C antibody for screening purposes

## 2022-09-09 NOTE — Telephone Encounter (Signed)
Last labs completed 07/24/21-CBC,HEPATIC,BMET and LIPASE. Please advise. Thank you

## 2022-09-10 NOTE — Telephone Encounter (Signed)
Lab orders placed and pt is aware 

## 2022-09-12 LAB — HEPATITIS C ANTIBODY: Hep C Virus Ab: NONREACTIVE

## 2022-09-12 LAB — LIPID PANEL
Chol/HDL Ratio: 4 ratio (ref 0.0–5.0)
Cholesterol, Total: 166 mg/dL (ref 100–199)
HDL: 42 mg/dL (ref >39)
LDL Chol Calc (NIH): 107 mg/dL — ABNORMAL HIGH (ref 0–99)
Triglycerides: 89 mg/dL (ref 0–149)
VLDL Cholesterol Cal: 17 mg/dL (ref 5–40)

## 2022-09-12 LAB — GLUCOSE, RANDOM: Glucose: 85 mg/dL (ref 70–99)

## 2022-10-29 ENCOUNTER — Encounter: Payer: No Typology Code available for payment source | Admitting: Family Medicine

## 2022-11-03 ENCOUNTER — Encounter: Payer: Self-pay | Admitting: Family Medicine

## 2022-11-03 ENCOUNTER — Ambulatory Visit (INDEPENDENT_AMBULATORY_CARE_PROVIDER_SITE_OTHER): Payer: No Typology Code available for payment source | Admitting: Family Medicine

## 2022-11-03 VITALS — BP 124/82 | Ht 74.0 in | Wt 271.0 lb

## 2022-11-03 DIAGNOSIS — Z0000 Encounter for general adult medical examination without abnormal findings: Secondary | ICD-10-CM | POA: Diagnosis not present

## 2022-11-03 DIAGNOSIS — M25562 Pain in left knee: Secondary | ICD-10-CM | POA: Diagnosis not present

## 2022-11-03 DIAGNOSIS — M25561 Pain in right knee: Secondary | ICD-10-CM | POA: Diagnosis not present

## 2022-11-03 MED ORDER — NAPROXEN 500 MG PO TABS: ORAL_TABLET | ORAL | 1 refills | Status: DC

## 2022-11-03 NOTE — Progress Notes (Signed)
   Subjective:    Patient ID: Tracy Riley, male    DOB: [birth year], 21 y.o.   MRN: [id number]  HPI The patient comes in today for a wellness visit.  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  A review of medications was also completed.  Any needed refills; not on any meds at this time  Eating habits: Good  Falls/  MVA accidents in past few months: none  Regular exercise: yes; 3 times a week (walks and stuff out side)  Specialist pt sees on regular basis: Chiropractor- was seeing him 2 times a month  Preventative health issues were discussed.   Additional concerns: knee pain; pt referees and afterwards he is hardly able to stand. Feet stink nowadays per pt.     Review of Systems     Objective:   Physical Exam General-in no acute distress Eyes-no discharge Lungs-respiratory rate normal, CTA CV-no murmurs,RRR Extremities skin warm dry no edema Neuro grossly normal Behavior normal, alert        Assessment & Plan:  1. Well adult exam Adult wellness-complete.wellness physical was conducted today. Importance of diet and exercise were discussed in detail.  Importance of stress reduction and healthy living were discussed.  In addition to this a discussion regarding safety was also covered.  We also reviewed over immunizations and gave recommendations regarding current immunization needed for age.   In addition to this additional areas were also touched on including: Preventative health exams needed:  Colonoscopy not indicated Previous lab work showed good glucose cholesterol slightly elevated follow yearly  healthy diet recommended Patient was advised yearly wellness exam   2. Pain in both knees, unspecified chronicity May use naproxen 500 mg 1 twice daily as needed sparing use not for frequent use Patient states he was using OTC Naprosyn 3 at a time which was 660 mg this will be 500 will be safer not for frequent use

## 2023-01-29 ENCOUNTER — Telehealth: Payer: No Typology Code available for payment source | Admitting: Physician Assistant

## 2023-01-29 DIAGNOSIS — J069 Acute upper respiratory infection, unspecified: Secondary | ICD-10-CM | POA: Diagnosis not present

## 2023-01-29 DIAGNOSIS — B9689 Other specified bacterial agents as the cause of diseases classified elsewhere: Secondary | ICD-10-CM | POA: Diagnosis not present

## 2023-01-29 MED ORDER — PROMETHAZINE-DM 6.25-15 MG/5ML PO SYRP: 5.0000 mL | ORAL_SOLUTION | Freq: Four times a day (QID) | ORAL | 0 refills | Status: DC | PRN

## 2023-01-29 MED ORDER — BENZONATATE 100 MG PO CAPS
100.0000 mg | ORAL_CAPSULE | Freq: Three times a day (TID) | ORAL | 0 refills | Status: DC | PRN
Start: 2023-01-29 — End: 2023-05-13

## 2023-01-29 MED ORDER — ALBUTEROL SULFATE HFA 108 (90 BASE) MCG/ACT IN AERS: 1.0000 | INHALATION_SPRAY | Freq: Four times a day (QID) | RESPIRATORY_TRACT | 0 refills | Status: DC | PRN

## 2023-01-29 MED ORDER — AMOXICILLIN-POT CLAVULANATE 875-125 MG PO TABS: 1.0000 | ORAL_TABLET | Freq: Two times a day (BID) | ORAL | 0 refills | Status: DC

## 2023-01-29 NOTE — Patient Instructions (Signed)
Dana Allan, thank you for joining Mar Daring, PA-C for today's virtual visit.  While this provider is not your primary care provider (PCP), if your PCP is located in our provider database this encounter information will be shared with them immediately following your visit.   [facility] account gives you access to today's visit and all your visits, tests, and labs performed at [facility] " click here if you don't have a [facility] account or go to [email or web address]  Consent: (Patient) Dana Allan provided verbal consent for this virtual visit at the beginning of the encounter.  Current Medications:  Current Outpatient Medications:    albuterol (VENTOLIN HFA) 108 (90 Base) MCG/ACT inhaler, Inhale 1-2 puffs into the lungs every 6 (six) hours as needed., Disp: 8 g, Rfl: 0   amoxicillin-clavulanate (AUGMENTIN) 875-125 MG tablet, Take 1 tablet by mouth 2 (two) times daily., Disp: 20 tablet, Rfl: 0   benzonatate (TESSALON) 100 MG capsule, Take 1 capsule (100 mg total) by mouth 3 (three) times daily as needed., Disp: 30 capsule, Rfl: 0   naproxen (NAPROSYN) 500 MG tablet, One bid prn pain knee pain, Disp: 30 tablet, Rfl: 1   promethazine-dextromethorphan (PROMETHAZINE-DM) 6.25-15 MG/5ML syrup, Take 5 mLs by mouth 4 (four) times daily as needed., Disp: 118 mL, Rfl: 0   Medications ordered in this encounter:  Meds ordered this encounter  Medications   amoxicillin-clavulanate (AUGMENTIN) 875-125 MG tablet    Sig: Take 1 tablet by mouth 2 (two) times daily.    Dispense:  20 tablet    Refill:  0    Order Specific Question:   Supervising Provider    Answer:   Chase Picket [id number]   benzonatate (TESSALON) 100 MG capsule    Sig: Take 1 capsule (100 mg total) by mouth 3 (three) times daily as needed.    Dispense:  30 capsule    Refill:  0    Order Specific Question:   Supervising Provider    Answer:   Chase Picket [id number]    promethazine-dextromethorphan (PROMETHAZINE-DM) 6.25-15 MG/5ML syrup    Sig: Take 5 mLs by mouth 4 (four) times daily as needed.    Dispense:  118 mL    Refill:  0    Order Specific Question:   Supervising Provider    Answer:   Chase Picket [id number]   albuterol (VENTOLIN HFA) 108 (90 Base) MCG/ACT inhaler    Sig: Inhale 1-2 puffs into the lungs every 6 (six) hours as needed.    Dispense:  8 g    Refill:  0    Order Specific Question:   Supervising Provider    Answer:   Chase Picket [id number]     *If you need refills on other medications prior to your next appointment, please contact your pharmacy*  Follow-Up: Call back or seek an in-person evaluation if the symptoms worsen or if the condition fails to improve as anticipated.  [place] [phone]  Other Instructions  Upper Respiratory Infection, Adult An upper respiratory infection (URI) is a common viral infection of the nose, throat, and upper air passages that lead to the lungs. The most common type of URI is the common cold. URIs usually get better on their own, without medical treatment. What are the causes? A URI is caused by a virus. You may catch a virus by: Breathing in droplets from an infected person's cough or sneeze. Touching something that has  been exposed to the virus (is contaminated) and then touching your mouth, nose, or eyes. What increases the risk? You are more likely to get a URI if: You are very young or very old. You have close contact with others, such as at work, school, or a health care facility. You smoke. You have long-term (chronic) heart or lung disease. You have a weakened disease-fighting system (immune system). You have nasal allergies or asthma. You are experiencing a lot of stress. You have poor nutrition. What are the signs or symptoms? A URI usually involves some of the following symptoms: Runny or stuffy (congested) nose. Cough. Sneezing. Sore  throat. Headache. Fatigue. Fever. Loss of appetite. Pain in your forehead, behind your eyes, and over your cheekbones (sinus pain). Muscle aches. Redness or irritation of the eyes. Pressure in the ears or face. How is this diagnosed? This condition may be diagnosed based on your medical history and symptoms, and a physical exam. Your health care provider may use a swab to take a mucus sample from your nose (nasal swab). This sample can be tested to determine what virus is causing the illness. How is this treated? URIs usually get better on their own within 7-10 days. Medicines cannot cure URIs, but your health care provider may recommend certain medicines to help relieve symptoms, such as: Over-the-counter cold medicines. Cough suppressants. Coughing is a type of defense against infection that helps to clear the respiratory system, so take these medicines only as recommended by your health care provider. Fever-reducing medicines. Follow these instructions at home: Activity Rest as needed. If you have a fever, stay home from work or school until your fever is gone or until your health care provider says your URI cannot spread to other people (is no longer contagious). Your health care provider may have you wear a face mask to prevent your infection from spreading. Relieving symptoms Gargle with a mixture of salt and water 3-4 times a day or as needed. To make salt water, completely dissolve -1 tsp (3-6 g) of salt in 1 cup (237 mL) of warm water. Use a cool-mist humidifier to add moisture to the air. This can help you breathe more easily. Eating and drinking  Drink enough fluid to keep your urine pale yellow. Eat soups and other clear broths. General instructions  Take over-the-counter and prescription medicines only as told by your health care provider. These include cold medicines, fever reducers, and cough suppressants. Do not use any products that contain nicotine or tobacco. These  products include cigarettes, chewing tobacco, and vaping devices, such as e-cigarettes. If you need help quitting, ask your health care provider. Stay away from secondhand smoke. Stay up to date on all immunizations, including the yearly (annual) flu vaccine. Keep all follow-up visits. This is important. How to prevent the spread of infection to others URIs can be contagious. To prevent the infection from spreading: Wash your hands with soap and water for at least 20 seconds. If soap and water are not available, use hand sanitizer. Avoid touching your mouth, face, eyes, or nose. Cough or sneeze into a tissue or your sleeve or elbow instead of into your hand or into the air.  Contact a health care provider if: You are getting worse instead of better. You have a fever or chills. Your mucus is brown or red. You have yellow or brown discharge coming from your nose. You have pain in your face, especially when you bend forward. You have swollen neck glands. You  have pain while swallowing. You have white areas in the back of your throat. Get help right away if: You have shortness of breath that gets worse. You have severe or persistent: Headache. Ear pain. Sinus pain. Chest pain. You have chronic lung disease along with any of the following: Making high-pitched whistling sounds when you breathe, most often when you breathe out (wheezing). Prolonged cough (more than 14 days). Coughing up blood. A change in your usual mucus. You have a stiff neck. You have changes in your: Vision. Hearing. Thinking. Mood. These symptoms may be an emergency. Get help right away. Call 911. Do not wait to see if the symptoms will go away. Do not drive yourself to the hospital. Summary An upper respiratory infection (URI) is a common infection of the nose, throat, and upper air passages that lead to the lungs. A URI is caused by a virus. URIs usually get better on their own within 7-10 days. Medicines  cannot cure URIs, but your health care provider may recommend certain medicines to help relieve symptoms. This information is not intended to replace advice given to you by your health care provider. Make sure you discuss any questions you have with your health care provider. Document Revised: 06/11/2021 Document Reviewed: 06/11/2021 Elsevier Patient Education  [place].    If you have been instructed to have an in-person evaluation today at a local Urgent Care facility, please use the link below. It will take you to a list of all of our available [facility], including address, phone number and hours of operation. Please do not delay care.  [facility]  If you or a family member do not have a primary care provider, use the link below to schedule a visit and establish care. When you choose a [facility] primary care physician or advanced practice provider, you gain a long-term partner in health. Find a Primary Care Provider  Learn more about [facility]'s in-office and virtual care options: [facility]

## 2023-01-29 NOTE — Progress Notes (Signed)
Virtual Visit Consent   Tracy Riley, you are scheduled for a virtual visit with a [facility] provider today. Just as with appointments in the office, your consent must be obtained to participate. Your consent will be active for this visit and any virtual visit you may have with one of our providers in the next 365 days. If you have a MyChart account, a copy of this consent can be sent to you electronically.  As this is a virtual visit, video technology does not allow for your provider to perform a traditional examination. This may limit your provider's ability to fully assess your condition. If your provider identifies any concerns that need to be evaluated in person or the need to arrange testing (such as labs, EKG, etc.), we will make arrangements to do so. Although advances in technology are sophisticated, we cannot ensure that it will always work on either your end or our end. If the connection with a video visit is poor, the visit may have to be switched to a telephone visit. With either a video or telephone visit, we are not always able to ensure that we have a secure connection.  By engaging in this virtual visit, you consent to the provision of healthcare and authorize for your insurance to be billed (if applicable) for the services provided during this visit. Depending on your insurance coverage, you may receive a charge related to this service.  I need to obtain your verbal consent now. Are you willing to proceed with your visit today? [facility] has provided verbal consent on 01/29/2023 for a virtual visit (video or telephone). Mar Daring, PA-C  Date: 01/29/2023 11:36 AM  Virtual Visit via Video Note   I, Mar Daring, connected with  Tracy Riley  ([id number], 12-20-2000) on 01/29/23 at 11:30 AM EST by a video-enabled telemedicine application and verified that I am speaking with the correct person using two identifiers.  Location: Patient: Virtual Visit  Location Patient: Mobile Provider: Virtual Visit Location Provider: Home Office   I discussed the limitations of evaluation and management by telemedicine and the availability of in person appointments. The patient expressed understanding and agreed to proceed.    History of Present Illness: Tracy Riley is a 22 y.o. who identifies as a male who was assigned male at birth, and is being seen today for flu-like symptoms.  HPI: URI  This is a new problem. The current episode started in the past 7 days (Monday, 01/25/23). There has been no fever. Associated symptoms include chest pain (with coughing), congestion, coughing, headaches, nausea (Tuesday), rhinorrhea (and post nasal drainage reported mild), sinus pain, a sore throat (started Thursday) and wheezing. Pertinent negatives include no diarrhea, ear pain, plugged ear sensation, swollen glands or vomiting. Treatments tried: Mucinex DM. The treatment provided no relief.   Does have chronic bronchitis from Covid 19 a couple years back.   Problems:  Patient Active Problem List   Diagnosis Date Noted   Strep pharyngitis 05/07/2022   Thoracic outlet syndrome 03/06/2020   Faint heart murmur 05/31/2014   Learning disability 05/07/2014    Allergies:  Allergies  Allergen Reactions   Zithromax [Azithromycin] Dermatitis    rash   Medications:  Current Outpatient Medications:    albuterol (VENTOLIN HFA) 108 (90 Base) MCG/ACT inhaler, Inhale 1-2 puffs into the lungs every 6 (six) hours as needed., Disp: 8 g, Rfl: 0   amoxicillin-clavulanate (AUGMENTIN) 875-125 MG tablet, Take 1 tablet by mouth 2 (two)  times daily., Disp: 20 tablet, Rfl: 0   benzonatate (TESSALON) 100 MG capsule, Take 1 capsule (100 mg total) by mouth 3 (three) times daily as needed., Disp: 30 capsule, Rfl: 0   naproxen (NAPROSYN) 500 MG tablet, One bid prn pain knee pain, Disp: 30 tablet, Rfl: 1   promethazine-dextromethorphan (PROMETHAZINE-DM) 6.25-15 MG/5ML syrup, Take 5  mLs by mouth 4 (four) times daily as needed., Disp: 118 mL, Rfl: 0  Observations/Objective: Patient is well-developed, well-nourished in no acute distress.  Resting comfortably  Head is normocephalic, atraumatic.  No labored breathing.  Speech is clear and coherent with logical content.  Patient is alert and oriented at baseline.    Assessment and Plan: 1. Bacterial upper respiratory infection - amoxicillin-clavulanate (AUGMENTIN) 875-125 MG tablet; Take 1 tablet by mouth 2 (two) times daily.  Dispense: 20 tablet; Refill: 0 - benzonatate (TESSALON) 100 MG capsule; Take 1 capsule (100 mg total) by mouth 3 (three) times daily as needed.  Dispense: 30 capsule; Refill: 0 - promethazine-dextromethorphan (PROMETHAZINE-DM) 6.25-15 MG/5ML syrup; Take 5 mLs by mouth 4 (four) times daily as needed.  Dispense: 118 mL; Refill: 0 - albuterol (VENTOLIN HFA) 108 (90 Base) MCG/ACT inhaler; Inhale 1-2 puffs into the lungs every 6 (six) hours as needed.  Dispense: 8 g; Refill: 0  - Worsening over a week despite OTC medications - Will treat with Augmentin, Albuterol, Promethazine DM, and tessalon perles - Can continue Mucinex  - Push fluids.  - Rest.  - Steam and humidifier can help - Seek in person evaluation if worsening or symptoms fail to improve    Follow Up Instructions: I discussed the assessment and treatment plan with the patient. The patient was provided an opportunity to ask questions and all were answered. The patient agreed with the plan and demonstrated an understanding of the instructions.  A copy of instructions were sent to the patient via MyChart unless otherwise noted below.    The patient was advised to call back or seek an in-person evaluation if the symptoms worsen or if the condition fails to improve as anticipated.  Time:  I spent 8 minutes with the patient via telehealth technology discussing the above problems/concerns.    Mar Daring, PA-C

## 2023-05-12 ENCOUNTER — Encounter: Payer: Self-pay | Admitting: Family Medicine

## 2023-05-13 ENCOUNTER — Ambulatory Visit: Payer: No Typology Code available for payment source | Admitting: Family Medicine

## 2023-05-13 DIAGNOSIS — J069 Acute upper respiratory infection, unspecified: Secondary | ICD-10-CM

## 2023-05-13 DIAGNOSIS — B9689 Other specified bacterial agents as the cause of diseases classified elsewhere: Secondary | ICD-10-CM | POA: Diagnosis not present

## 2023-05-13 MED ORDER — AMOXICILLIN-POT CLAVULANATE 875-125 MG PO TABS: 1.0000 | ORAL_TABLET | Freq: Two times a day (BID) | ORAL | 0 refills | Status: DC

## 2023-05-13 NOTE — Telephone Encounter (Signed)
Nurses Earna Coder may use over-the-counter allergy tablets like loratadine along with Astelin nasal spray  Does sound like he is developing a sinus infection-I am out of the office tomorrow but Eber Jones has appointments tomorrow afternoon late in the day please help set up thanks

## 2023-05-13 NOTE — Telephone Encounter (Signed)
Patient is being seen in office today

## 2023-05-13 NOTE — Progress Notes (Signed)
   Subjective:    Patient ID: Tracy Riley, male    DOB: Jul 27, 2001, 22 y.o.   MRN: [id number]  Cough Episode onset: five days ago. The problem has been rapidly worsening. The cough is Productive of blood-tinged sputum. Associated symptoms include chest pain, nasal congestion and wheezing. He has tried OTC cough suppressant for the symptoms.  He was exposed to a young child with a head cold about a week ago then he started developing sore throat coughing congestion denies wheezing or difficulty breathing    Review of Systems  Respiratory:  Positive for cough and wheezing.   Cardiovascular:  Positive for chest pain.       Objective:   Physical Exam Eardrums normal mild sinus tenderness throat is normal neck no masses lungs clear       Assessment & Plan:  Acute rhinosinusitis antibiotics prescribed May use OTC allergy medicines follow-up if progressive troubles

## 2023-06-11 ENCOUNTER — Encounter: Payer: Self-pay | Admitting: Family Medicine

## 2023-06-11 NOTE — Telephone Encounter (Signed)
Patient is due for lipid profile Please communicate with patient to see if there were other tests that his workplace specifically wanted?  Within the past year he is already had liver kidney function CBC which looked good but cholesterol moderately elevated so therefore repeat lipid.  If there are other tests mandated by his workplace please let me know

## 2023-06-14 NOTE — Telephone Encounter (Signed)
Lipid, CMP, CBC Screening hyperlipidemia, wellness

## 2023-06-15 ENCOUNTER — Other Ambulatory Visit: Payer: Self-pay

## 2023-06-15 DIAGNOSIS — Z13220 Encounter for screening for lipoid disorders: Secondary | ICD-10-CM

## 2023-06-15 DIAGNOSIS — Z0000 Encounter for general adult medical examination without abnormal findings: Secondary | ICD-10-CM

## 2023-06-15 DIAGNOSIS — E7849 Other hyperlipidemia: Secondary | ICD-10-CM

## 2023-08-06 ENCOUNTER — Telehealth: Payer: No Typology Code available for payment source | Admitting: Family Medicine

## 2024-01-13 ENCOUNTER — Encounter: Payer: Self-pay | Admitting: Family Medicine

## 2024-03-22 ENCOUNTER — Ambulatory Visit (INDEPENDENT_AMBULATORY_CARE_PROVIDER_SITE_OTHER): Payer: Self-pay | Admitting: Family Medicine

## 2024-03-22 VITALS — BP 124/86 | HR 86 | Temp 98.2°F | Ht 74.0 in | Wt 281.4 lb

## 2024-03-22 DIAGNOSIS — E669 Obesity, unspecified: Secondary | ICD-10-CM

## 2024-03-22 DIAGNOSIS — E785 Hyperlipidemia, unspecified: Secondary | ICD-10-CM | POA: Diagnosis not present

## 2024-03-22 DIAGNOSIS — L600 Ingrowing nail: Secondary | ICD-10-CM | POA: Diagnosis not present

## 2024-03-22 DIAGNOSIS — Z0001 Encounter for general adult medical examination with abnormal findings: Secondary | ICD-10-CM

## 2024-03-22 DIAGNOSIS — Z0000 Encounter for general adult medical examination without abnormal findings: Secondary | ICD-10-CM | POA: Insufficient documentation

## 2024-03-22 DIAGNOSIS — F4323 Adjustment disorder with mixed anxiety and depressed mood: Secondary | ICD-10-CM | POA: Insufficient documentation

## 2024-03-22 DIAGNOSIS — Z130 Encounter for screening for diseases of the blood and blood-forming organs and certain disorders involving the immune mechanism: Secondary | ICD-10-CM

## 2024-03-22 NOTE — Patient Instructions (Signed)
 Labs ordered.  I will keep Dr. Geralyn Knee informed.  Referral placed to podiatry.

## 2024-03-22 NOTE — Assessment & Plan Note (Signed)
 Discussed medication.  Patient declined at this time.  Patient advised to continue counseling.  He is to let us  know if he worsens.

## 2024-03-22 NOTE — Progress Notes (Signed)
 Subjective:  Patient ID: Tracy Riley, male    DOB: September 24, 2001  Age: 23 y.o. MRN: [id number]  CC:  Physical   HPI:  23 year old male presents for an annual exam.  Chart reflects that he is a candidate for pneumococcal vaccine.  He has no risk factors.  This is not indicated.  Will discuss obtaining screening labs.  He states that overall he is doing okay.  He is experiencing pain associated with an ingrown toenail.  Requesting referral to podiatry.  Patient also reports that he is struggling with depression and anxiety.  He states that he has legal charges out against him and he has been going through a difficult time as a result.  He has had brief thoughts of self-harm and suicide about a month ago.  No plan.  No intent.  He is talking to a pastor on a regular basis as his counselor.  Will discuss the possibility of medication today.  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   Annual physical exam 03/22/2024   Ingrown toenail 03/22/2024   Learning disability 05/07/2014    Social Hx   Social History   Socioeconomic History   Marital status: Single    Spouse name: Not on file   Number of children: Not on file   Years of education: Not on file   Highest education level: 12th grade  Occupational History   Not on file  Tobacco Use   Smoking status: Some Days    Types: Cigars   Smokeless tobacco: Never  Substance and Sexual Activity   Alcohol use: Yes    Alcohol/week: 1.0 standard drink of alcohol    Types: 1 Shots of liquor per week    Comment: "moderate"   Drug use: No   Sexual activity: Not on file  Other Topics Concern   Not on file  Social History Narrative   Not on file   Social Drivers of Health   Financial Resource Strain: Low Risk  (05/13/2023)   Overall Financial Resource Strain (CARDIA)    Difficulty of Paying Living Expenses: Not very hard  Food Insecurity: No Food Insecurity  (05/13/2023)   Hunger Vital Sign    Worried About Running Out of Food in the Last Year: Never true    Ran Out of Food in the Last Year: Never true  Transportation Needs: No Transportation Needs (05/13/2023)   PRAPARE - Administrator, Civil Service (Medical): No    Lack of Transportation (Non-Medical): No  Physical Activity: Sufficiently Active (05/13/2023)   Exercise Vital Sign    Days of Exercise per Week: 3 days    Minutes of Exercise per Session: 60 min  Stress: No Stress Concern Present (05/13/2023)   [employer] of Occupational Health - Occupational Stress Questionnaire    Feeling of Stress : Only a little  Social Connections: Moderately Isolated (05/13/2023)   Social Connection and Isolation Panel [NHANES]    Frequency of Communication with Friends and Family: Twice a week    Frequency of Social Gatherings with Friends and Family: Once a week    Attends Religious Services: More than 4 times per year    Active Member of [employer] or Organizations: No    Attends Engineer, structural: Not on file    Marital Status: Never married    Review of Systems Per HPI  Objective:  BP 124/86   Pulse 86  Temp 98.2 F (36.8 C)   Ht 6\' 2"  (1.88 m)   Wt 281 lb 6.4 oz (127.6 kg)   SpO2 96%   BMI 36.13 kg/m      03/22/2024   10:11 AM 05/13/2023    4:40 PM 05/13/2023    4:18 PM  BP/Weight  Systolic BP 124 122 137  Diastolic BP 86 88 91  Wt. (Lbs) 281.4  270.6  BMI 36.13 kg/m2  34.74 kg/m2    Physical Exam  Lab Results  Component Value Date   WBC 5.7 07/24/2021   HGB 15.7 07/24/2021   HCT 46.6 07/24/2021   PLT 204 07/24/2021   GLUCOSE 85 09/11/2022   CHOL 166 09/11/2022   TRIG 89 09/11/2022   HDL 42 09/11/2022   LDLCALC 107 (H) 09/11/2022   ALT 19 07/24/2021   AST 16 07/24/2021   NA 138 07/24/2021   K 4.5 07/24/2021   CL 99 07/24/2021   CREATININE 1.00 07/24/2021   BUN 10 07/24/2021   CO2 24 07/24/2021     Assessment & Plan:  Annual physical  exam Assessment & Plan: Screening labs.  Patient has an occasional cigar.  He is not drinking alcohol.  Advised healthy diet and regular exercise.   Ingrown toenail Assessment & Plan: Referral to podiatry.  Orders: -     Ambulatory referral to Podiatry  Obesity (BMI 30-39.9) -     CMP14+EGFR  Hyperlipidemia, unspecified hyperlipidemia type -     Lipid panel  Screening for deficiency anemia -     CBC  Adjustment reaction with anxiety and depression Assessment & Plan: Discussed medication.  Patient declined at this time.  Patient advised to continue counseling.  He is to let us  know if he worsens.    Follow-up:  Annually or sooner if needed  Gabriel Conry DO [facility]

## 2024-03-22 NOTE — Assessment & Plan Note (Signed)
 Referral to podiatry

## 2024-03-22 NOTE — Assessment & Plan Note (Signed)
 Screening labs.  Patient has an occasional cigar.  He is not drinking alcohol.  Advised healthy diet and regular exercise.

## 2024-03-23 ENCOUNTER — Encounter: Payer: Self-pay | Admitting: Family Medicine

## 2024-03-23 LAB — CMP14+EGFR
ALT: 40 IU/L (ref 0–44)
AST: 23 IU/L (ref 0–40)
Albumin: 4.7 g/dL (ref 4.3–5.2)
Alkaline Phosphatase: 105 IU/L (ref 44–121)
BUN/Creatinine Ratio: 14 (ref 9–20)
BUN: 13 mg/dL (ref 6–20)
Bilirubin Total: 0.6 mg/dL (ref 0.0–1.2)
CO2: 23 mmol/L (ref 20–29)
Calcium: 9.7 mg/dL (ref 8.7–10.2)
Chloride: 102 mmol/L (ref 96–106)
Creatinine, Ser: 0.96 mg/dL (ref 0.76–1.27)
Globulin, Total: 2.5 g/dL (ref 1.5–4.5)
Glucose: 83 mg/dL (ref 70–99)
Potassium: 4.1 mmol/L (ref 3.5–5.2)
Sodium: 140 mmol/L (ref 134–144)
Total Protein: 7.2 g/dL (ref 6.0–8.5)
eGFR: 113 mL/min/{1.73_m2} (ref >59)

## 2024-03-23 LAB — LIPID PANEL
Chol/HDL Ratio: 4.2 ratio (ref 0.0–5.0)
Cholesterol, Total: 176 mg/dL (ref 100–199)
HDL: 42 mg/dL (ref >39)
LDL Chol Calc (NIH): 116 mg/dL — ABNORMAL HIGH (ref 0–99)
Triglycerides: 99 mg/dL (ref 0–149)
VLDL Cholesterol Cal: 18 mg/dL (ref 5–40)

## 2024-03-23 LAB — CBC
Hematocrit: 45.6 % (ref 37.5–51.0)
Hemoglobin: 15.3 g/dL (ref 13.0–17.7)
MCH: 28.9 pg (ref 26.6–33.0)
MCHC: 33.6 g/dL (ref 31.5–35.7)
MCV: 86 fL (ref 79–97)
Platelets: 231 10*3/uL (ref 150–450)
RBC: 5.3 x10E6/uL (ref 4.14–5.80)
RDW: 13 % (ref 11.6–15.4)
WBC: 6.1 10*3/uL (ref 3.4–10.8)

## 2024-04-14 ENCOUNTER — Encounter: Payer: Self-pay | Admitting: Podiatry

## 2024-04-14 ENCOUNTER — Ambulatory Visit: Admitting: Podiatry

## 2024-04-14 VITALS — Ht 74.0 in | Wt 281.0 lb

## 2024-04-14 DIAGNOSIS — L600 Ingrowing nail: Secondary | ICD-10-CM

## 2024-04-14 NOTE — Progress Notes (Signed)
 Subjective:   Patient ID: Tracy Riley, male   DOB: 23 y.o.   MRN: [id number]   HPI Patient presents with caregiver with chronic ingrown toenail left big toe that is been very sore and making shoe gear difficult.  States that he has had this on the right one and that this ones been bothering him for a number of months has tried soaks and trims without relief of symptoms   Review of Systems  All other systems reviewed and are negative.       Objective:  Physical Exam Vitals and nursing note reviewed.  Constitutional:      Appearance: He is well-developed.  Pulmonary:     Effort: Pulmonary effort is normal.  Musculoskeletal:        General: Normal range of motion.  Skin:    General: Skin is warm.  Neurological:     Mental Status: He is alert.     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  Explained risk of surgery patient signed consent form after review I infiltrated the left big toe 60 mg Xylocaine Marcaine mixture sterile prep done using sterile instrumentation remove the borders exposed matrix applied phenol to each border 3 applications 30 seconds followed by alcohol lavage sterile dressing gave instructions on soaks wear dressings 24 hours take them off earlier if throbbing were to occur and encouraged to call questions concerns which may arise

## 2024-04-14 NOTE — Patient Instructions (Signed)

## 2024-05-04 ENCOUNTER — Encounter: Payer: Self-pay | Admitting: Family Medicine

## 2024-05-05 ENCOUNTER — Encounter: Payer: Self-pay | Admitting: Family Medicine

## 2024-05-05 ENCOUNTER — Ambulatory Visit: Admitting: Family Medicine

## 2024-05-05 VITALS — BP 135/88 | HR 91 | Temp 98.6°F | Ht 74.0 in | Wt 276.0 lb

## 2024-05-05 DIAGNOSIS — L600 Ingrowing nail: Secondary | ICD-10-CM

## 2024-05-05 DIAGNOSIS — L03032 Cellulitis of left toe: Secondary | ICD-10-CM | POA: Diagnosis not present

## 2024-05-05 MED ORDER — KETOCONAZOLE 2 % EX CREA: TOPICAL_CREAM | CUTANEOUS | 4 refills | Status: AC

## 2024-05-05 MED ORDER — AMOXICILLIN-POT CLAVULANATE 875-125 MG PO TABS: 1.0000 | ORAL_TABLET | Freq: Two times a day (BID) | ORAL | 0 refills | Status: DC

## 2024-05-05 NOTE — Progress Notes (Signed)
   Subjective:    Patient ID: Tracy Riley, male    DOB: September 26, 2001, 23 y.o.   MRN: [id number]  HPI Follow up for left great toe procedure Possible infection Discussed the use of AI scribe software for clinical note transcription with the patient, who gave verbal consent to proceed.  History of Present Illness   Tracy Riley is a 23 year old male who presents with an infected toe following a recent podiatric procedure.  Three weeks ago, he underwent a procedure at a foot and ankle clinic where both sides of his toe were cut to address an issue. Since then, he has been experiencing signs of infection, including a bad odor and swelling. He has been soaking the toe in peroxide and salt water to manage the symptoms. The toe does not hurt as much as it used to.  He works primarily outdoors, which involves wearing protective footwear that can trap heat and humidity, potentially contributing to the infection. He has been managing the condition by soaking the toe and using warm compresses.  There is a genetic predisposition to diabetes in his family.  No significant pain currently, but reports of bad odor and swelling in the affected toe.       Review of Systems     Objective:   Physical Exam  General-in no acute distress Eyes-no discharge Lungs-respiratory rate normal, CTA CV-no murmurs,RRR Extremities skin warm dry no edema Neuro grossly normal Behavior normal, alert Foot exam on the left side indicates cellulitis of the left great toe there is a small possibility there is still ingrown toenail but I think that is less likely I think it is more likely just cellulitis no abscess he also has tinea pedis      Assessment & Plan:   Assessment and Plan    Infected toe with cellulitis Infected toe likely due to cellulitis post-ingrown toenail procedure. Persistent infection with malodor. Suspected bacterial infection exacerbated by footwear. Possible remnant of ingrown nail if  infection recurs. - Prescribe Augmentin  875 mg orally twice daily for 10 days. - Perform culture of infected area. - Advise airing out feet and changing socks. - Refer to podiatry if infection persists or recurs.  Tinea pedis Fungal infection between toes due to warm, humid conditions. - Prescribe ketoconazole  cream, apply thinly between toes two to three times daily.   Diabetes screening Family history of diabetes with normal glucose levels. Genetic predisposition warrants monitoring fasting glucose levels. - Instruct to check fasting glucose at home over next few weeks. - Advise reporting glucose readings, especially if above 100 mg/dL.  Follow-up Updates on condition and glucose levels needed. - Send update on toe infection and glucose levels in 10 to 14 days. - Contact office sooner if problems arise.

## 2024-05-09 ENCOUNTER — Ambulatory Visit: Payer: Self-pay | Admitting: Family Medicine

## 2024-05-09 LAB — WOUND CULTURE

## 2024-05-09 LAB — SPECIMEN STATUS REPORT

## 2024-05-11 ENCOUNTER — Encounter: Payer: Self-pay | Admitting: Family Medicine

## 2024-12-07 ENCOUNTER — Ambulatory Visit: Admitting: Family Medicine

## 2024-12-07 VITALS — BP 145/84 | HR 88 | Temp 98.6°F | Ht 74.0 in | Wt 285.0 lb

## 2024-12-07 DIAGNOSIS — J329 Chronic sinusitis, unspecified: Secondary | ICD-10-CM | POA: Diagnosis not present

## 2024-12-07 MED ORDER — AMOXICILLIN-POT CLAVULANATE 875-125 MG PO TABS: 1.0000 | ORAL_TABLET | Freq: Two times a day (BID) | ORAL | 0 refills | Status: AC

## 2024-12-10 DIAGNOSIS — J329 Chronic sinusitis, unspecified: Secondary | ICD-10-CM | POA: Insufficient documentation

## 2024-12-10 NOTE — Assessment & Plan Note (Signed)
 Given persistence and lack of improvement, I am treating him with Augmentin .

## 2024-12-10 NOTE — Progress Notes (Signed)
 "  Subjective:  Patient ID: Tracy Riley, male    DOB: Apr 25, 2001  Age: 24 y.o. MRN: [id number]  CC:   Chief Complaint  Patient presents with   URI    On and off past 1.5 months 3rd occurrence now has nasal congestion and drainage, headaches, trouble sleeping, deep occ cough, occ neck and shoulder aches    HPI:  24 year old male presents for evaluation of the above.  Patient reports that he has had ongoing sinus issues for the past 1.5 months.  Symptoms seem to get better and then recur.  He reports sinus pressure, headaches, and postnasal drip.  No fever.  Occasional cough.  Social Hx   Social History   Socioeconomic History   Marital status: Single    Spouse name: Not on file   Number of children: Not on file   Years of education: Not on file   Highest education level: 12th grade  Occupational History   Not on file  Tobacco Use   Smoking status: Some Days    Types: Cigars   Smokeless tobacco: Never  Substance and Sexual Activity   Alcohol use: Yes    Alcohol/week: 1.0 standard drink of alcohol    Types: 1 Shots of liquor per week    Comment: moderate   Drug use: No   Sexual activity: Not on file  Other Topics Concern   Not on file  Social History Narrative   Not on file   Social Drivers of Health   Tobacco Use: High Risk (05/05/2024)   Patient History    Smoking Tobacco Use: Some Days    Smokeless Tobacco Use: Never    Passive Exposure: Not on file  Financial Resource Strain: Medium Risk (12/06/2024)   Overall Financial Resource Strain (CARDIA)    Difficulty of Paying Living Expenses: Somewhat hard  Food Insecurity: No Food Insecurity (12/06/2024)   Epic    Worried About Programme Researcher, Broadcasting/film/video in the Last Year: Never true    Ran Out of Food in the Last Year: Never true  Transportation Needs: No Transportation Needs (12/06/2024)   Epic    Lack of Transportation (Medical): No    Lack of Transportation (Non-Medical): No  Physical Activity: Sufficiently  Active (12/06/2024)   Exercise Vital Sign    Days of Exercise per Week: 3 days    Minutes of Exercise per Session: 60 min  Stress: Stress Concern Present (12/06/2024)   [employer] of Occupational Health - Occupational Stress Questionnaire    Feeling of Stress: Rather much  Social Connections: Moderately Integrated (12/06/2024)   Social Connection and Isolation Panel    Frequency of Communication with Friends and Family: More than three times a week    Frequency of Social Gatherings with Friends and Family: Once a week    Attends Religious Services: More than 4 times per year    Active Member of Clubs or Organizations: Yes    Attends Banker Meetings: 1 to 4 times per year    Marital Status: Never married  Depression (PHQ2-9): Medium Risk (05/05/2024)   Depression (PHQ2-9)    PHQ-2 Score: 7  Alcohol Screen: Low Risk (12/06/2024)   Alcohol Screen    Last Alcohol Screening Score (AUDIT): 0  Housing: Low Risk (12/06/2024)   Epic    Unable to Pay for Housing in the Last Year: No    Number of Times Moved in the Last Year: 0    Homeless in the Last Year: No  Utilities: Not on file  Health Literacy: Not on file    Review of Systems Per HPI  Objective:  BP (!) 145/84   Pulse 88   Temp 98.6 F (37 C)   Ht 6' 2 (1.88 m)   Wt 285 lb (129.3 kg)   SpO2 98%   BMI 36.59 kg/m      12/07/2024   10:02 AM 05/05/2024    8:24 AM 04/14/2024    9:11 AM  BP/Weight  Systolic BP 145 135   Diastolic BP 84 88   Wt. (Lbs) 285 276 281  BMI 36.59 kg/m2 35.44 kg/m2 36.08 kg/m2    Physical Exam Vitals and nursing note reviewed.  Constitutional:      General: He is not in acute distress.    Appearance: Normal appearance.  HENT:     Head: Normocephalic and atraumatic.  Eyes:     General:        Right eye: No discharge.        Left eye: No discharge.     Conjunctiva/sclera: Conjunctivae normal.  Cardiovascular:     Rate and Rhythm: Normal rate and regular rhythm.   Pulmonary:     Effort: Pulmonary effort is normal.     Breath sounds: Normal breath sounds. No wheezing, rhonchi or rales.  Neurological:     Mental Status: He is alert.  Psychiatric:        Mood and Affect: Mood normal.        Behavior: Behavior normal.     Lab Results  Component Value Date   WBC 6.1 03/22/2024   HGB 15.3 03/22/2024   HCT 45.6 03/22/2024   PLT 231 03/22/2024   GLUCOSE 83 03/22/2024   CHOL 176 03/22/2024   TRIG 99 03/22/2024   HDL 42 03/22/2024   LDLCALC 116 (H) 03/22/2024   ALT 40 03/22/2024   AST 23 03/22/2024   NA 140 03/22/2024   K 4.1 03/22/2024   CL 102 03/22/2024   CREATININE 0.96 03/22/2024   BUN 13 03/22/2024   CO2 23 03/22/2024     Assessment & Plan:  Rhinosinusitis Assessment & Plan: Given persistence and lack of improvement, I am treating him with Augmentin .  Orders: -     Amoxicillin -Pot Clavulanate; Take 1 tablet by mouth 2 (two) times daily.  Dispense: 20 tablet; Refill: 0    Follow-up:  Return if symptoms worsen or fail to improve.  Jacqulyn Ahle DO [facility] "

## 2024-12-20 ENCOUNTER — Encounter: Payer: Self-pay | Admitting: Family Medicine

## 2024-12-20 ENCOUNTER — Ambulatory Visit: Admitting: Family Medicine

## 2024-12-20 VITALS — BP 128/74 | HR 100 | Temp 98.4°F | Ht 74.0 in | Wt 286.0 lb

## 2024-12-20 DIAGNOSIS — J329 Chronic sinusitis, unspecified: Secondary | ICD-10-CM

## 2024-12-20 MED ORDER — LEVOFLOXACIN 500 MG PO TABS: 500.0000 mg | ORAL_TABLET | Freq: Every day | ORAL | 0 refills | Status: AC

## 2024-12-20 NOTE — Progress Notes (Signed)
" ° °  Subjective:    Patient ID: Tracy Riley, male    DOB: 2001/06/10, 24 y.o.   MRN: [id number]  HPI Patient is here for having a consistent cough that has been going on since Sunday.   Patient has had an on and off fever with body aches and sweats  Patient stated he has had an headache for about 6 weeks now . Patient has had head congestion going on for weeks progressive over the past 10 days with sinus pressure coughing intermittent body aches over the past couple days with low-grade fevers denies high fevers wheezing or difficulty breathing   Review of Systems     Objective:   Physical Exam Gen-NAD not toxic TMS-normal bilateral T- normal no redness Chest-CTA respiratory rate normal no crackles CV RRR no murmur Skin-warm dry Neuro-grossly normal        Assessment & Plan:  Patient has had prolonged respiratory illness Significant sinusitis for several weeks Over the past few days having some sweats and chills hard to know if he may have picked up the flu or getting a secondary infection Has already been on Augmentin  I do not hear any pneumonia on physical exam he does not appear toxic I would not recommend labs or x-rays currently but I would recommend Levaquin  500 milligrams daily for 7 days Patient encouraged to follow-up sooner if any problems Warning signs discussed No work next 3 days if not improving by the end of the week notify us  "

## 2024-12-20 NOTE — Telephone Encounter (Signed)
 Patient is schedule for 10am this morning.

## 2024-12-20 NOTE — Telephone Encounter (Signed)
 Nurses Given the symptoms and what is going on it would be best for you to be seen so we can listen to his lungs and make sure he is not developing pneumonia Please set up an appointment with myself or one of the additional providers here  Patient needs to be seen this week Thanks-Dr. Glendia

## 2025-05-17 ENCOUNTER — Encounter: Admitting: Family Medicine
